# Patient Record
Sex: Male | Born: 1945 | Race: White | Hispanic: No | Marital: Single
Health system: Southern US, Community
[De-identification: ages and names within clinical notes are randomized; demographics above are authoritative.]

---

## 2019-10-20 ENCOUNTER — Emergency Department (HOSPITAL_COMMUNITY): Payer: No Typology Code available for payment source

## 2019-10-20 ENCOUNTER — Inpatient Hospital Stay (HOSPITAL_COMMUNITY): Payer: No Typology Code available for payment source

## 2019-10-20 ENCOUNTER — Inpatient Hospital Stay (HOSPITAL_COMMUNITY)
Admission: EM | Admit: 2019-10-20 | Discharge: 2019-11-09 | DRG: 246 | Disposition: E | Payer: No Typology Code available for payment source | Attending: Cardiovascular Disease | Admitting: Cardiovascular Disease

## 2019-10-20 ENCOUNTER — Encounter (HOSPITAL_COMMUNITY): Payer: Self-pay | Admitting: Cardiovascular Disease

## 2019-10-20 ENCOUNTER — Inpatient Hospital Stay (HOSPITAL_COMMUNITY): Admission: EM | Disposition: E | Payer: Self-pay | Source: Home / Self Care | Attending: Cardiovascular Disease

## 2019-10-20 DIAGNOSIS — J9601 Acute respiratory failure with hypoxia: Secondary | ICD-10-CM | POA: Diagnosis present

## 2019-10-20 DIAGNOSIS — I5021 Acute systolic (congestive) heart failure: Secondary | ICD-10-CM | POA: Diagnosis present

## 2019-10-20 DIAGNOSIS — Z978 Presence of other specified devices: Secondary | ICD-10-CM | POA: Diagnosis not present

## 2019-10-20 DIAGNOSIS — Z515 Encounter for palliative care: Secondary | ICD-10-CM | POA: Diagnosis not present

## 2019-10-20 DIAGNOSIS — I469 Cardiac arrest, cause unspecified: Secondary | ICD-10-CM | POA: Diagnosis not present

## 2019-10-20 DIAGNOSIS — R042 Hemoptysis: Secondary | ICD-10-CM | POA: Diagnosis not present

## 2019-10-20 DIAGNOSIS — I2102 ST elevation (STEMI) myocardial infarction involving left anterior descending coronary artery: Secondary | ICD-10-CM | POA: Diagnosis present

## 2019-10-20 DIAGNOSIS — G931 Anoxic brain damage, not elsewhere classified: Secondary | ICD-10-CM | POA: Diagnosis present

## 2019-10-20 DIAGNOSIS — I959 Hypotension, unspecified: Secondary | ICD-10-CM | POA: Diagnosis present

## 2019-10-20 DIAGNOSIS — J9602 Acute respiratory failure with hypercapnia: Secondary | ICD-10-CM | POA: Diagnosis present

## 2019-10-20 DIAGNOSIS — Z20822 Contact with and (suspected) exposure to covid-19: Secondary | ICD-10-CM | POA: Diagnosis present

## 2019-10-20 DIAGNOSIS — I2510 Atherosclerotic heart disease of native coronary artery without angina pectoris: Secondary | ICD-10-CM | POA: Diagnosis not present

## 2019-10-20 DIAGNOSIS — E872 Acidosis: Secondary | ICD-10-CM | POA: Diagnosis present

## 2019-10-20 DIAGNOSIS — H5702 Anisocoria: Secondary | ICD-10-CM | POA: Diagnosis present

## 2019-10-20 DIAGNOSIS — I2109 ST elevation (STEMI) myocardial infarction involving other coronary artery of anterior wall: Secondary | ICD-10-CM

## 2019-10-20 DIAGNOSIS — Z66 Do not resuscitate: Secondary | ICD-10-CM | POA: Diagnosis not present

## 2019-10-20 DIAGNOSIS — R34 Anuria and oliguria: Secondary | ICD-10-CM | POA: Diagnosis present

## 2019-10-20 DIAGNOSIS — G9341 Metabolic encephalopathy: Secondary | ICD-10-CM | POA: Diagnosis present

## 2019-10-20 DIAGNOSIS — I462 Cardiac arrest due to underlying cardiac condition: Secondary | ICD-10-CM | POA: Diagnosis present

## 2019-10-20 DIAGNOSIS — Z9289 Personal history of other medical treatment: Secondary | ICD-10-CM

## 2019-10-20 DIAGNOSIS — D72829 Elevated white blood cell count, unspecified: Secondary | ICD-10-CM | POA: Diagnosis present

## 2019-10-20 DIAGNOSIS — I472 Ventricular tachycardia: Secondary | ICD-10-CM | POA: Diagnosis present

## 2019-10-20 DIAGNOSIS — R570 Cardiogenic shock: Secondary | ICD-10-CM | POA: Diagnosis present

## 2019-10-20 DIAGNOSIS — L821 Other seborrheic keratosis: Secondary | ICD-10-CM | POA: Diagnosis present

## 2019-10-20 DIAGNOSIS — I255 Ischemic cardiomyopathy: Secondary | ICD-10-CM | POA: Diagnosis present

## 2019-10-20 DIAGNOSIS — Z1389 Encounter for screening for other disorder: Secondary | ICD-10-CM

## 2019-10-20 HISTORY — PX: LEFT HEART CATH AND CORONARY ANGIOGRAPHY: CATH118249

## 2019-10-20 HISTORY — PX: CORONARY STENT INTERVENTION: CATH118234

## 2019-10-20 HISTORY — PX: CENTRAL LINE INSERTION: CATH118232

## 2019-10-20 HISTORY — PX: CORONARY/GRAFT ACUTE MI REVASCULARIZATION: CATH118305

## 2019-10-20 LAB — CBG MONITORING, ED: Glucose-Capillary: 254 mg/dL — ABNORMAL HIGH (ref 70–99)

## 2019-10-20 LAB — POCT I-STAT, CHEM 8
BUN: 11 mg/dL (ref 8–23)
Calcium, Ion: 1.11 mmol/L — ABNORMAL LOW (ref 1.15–1.40)
Chloride: 107 mmol/L (ref 98–111)
Creatinine, Ser: 0.7 mg/dL (ref 0.61–1.24)
Glucose, Bld: 231 mg/dL — ABNORMAL HIGH (ref 70–99)
HCT: 50 % (ref 39.0–52.0)
Hemoglobin: 17 g/dL (ref 13.0–17.0)
Potassium: 3.7 mmol/L (ref 3.5–5.1)
Sodium: 139 mmol/L (ref 135–145)
TCO2: 18 mmol/L — ABNORMAL LOW (ref 22–32)

## 2019-10-20 LAB — BASIC METABOLIC PANEL
Anion gap: 13 (ref 5–15)
BUN: 11 mg/dL (ref 8–23)
CO2: 16 mmol/L — ABNORMAL LOW (ref 22–32)
Calcium: 8.1 mg/dL — ABNORMAL LOW (ref 8.9–10.3)
Chloride: 110 mmol/L (ref 98–111)
Creatinine, Ser: 1.11 mg/dL (ref 0.61–1.24)
GFR calc Af Amer: >60 mL/min (ref >60)
GFR calc non Af Amer: >60 mL/min (ref >60)
Glucose, Bld: 189 mg/dL — ABNORMAL HIGH (ref 70–99)
Potassium: 3.8 mmol/L (ref 3.5–5.1)
Sodium: 139 mmol/L (ref 135–145)

## 2019-10-20 LAB — POCT I-STAT 7, (LYTES, BLD GAS, ICA,H+H)
Acid-base deficit: 13 mmol/L — ABNORMAL HIGH (ref 0.0–2.0)
Acid-base deficit: 10 mmol/L — ABNORMAL HIGH (ref 0.0–2.0)
Acid-base deficit: 10 mmol/L — ABNORMAL HIGH (ref 0.0–2.0)
Bicarbonate: 15.8 mmol/L — ABNORMAL LOW (ref 20.0–28.0)
Bicarbonate: 17.7 mmol/L — ABNORMAL LOW (ref 20.0–28.0)
Bicarbonate: 14.9 mmol/L — ABNORMAL LOW (ref 20.0–28.0)
Calcium, Ion: 1.1 mmol/L — ABNORMAL LOW (ref 1.15–1.40)
Calcium, Ion: 1.17 mmol/L (ref 1.15–1.40)
Calcium, Ion: 1.11 mmol/L — ABNORMAL LOW (ref 1.15–1.40)
HCT: 50 % (ref 39.0–52.0)
HCT: 52 % (ref 39.0–52.0)
HCT: 52 % (ref 39.0–52.0)
Hemoglobin: 17 g/dL (ref 13.0–17.0)
Hemoglobin: 17.7 g/dL — ABNORMAL HIGH (ref 13.0–17.0)
Hemoglobin: 17.7 g/dL — ABNORMAL HIGH (ref 13.0–17.0)
O2 Saturation: 99 %
O2 Saturation: 100 %
O2 Saturation: 99 %
Patient temperature: 34.9
Patient temperature: 32.6
Potassium: 3.7 mmol/L (ref 3.5–5.1)
Potassium: 3.8 mmol/L (ref 3.5–5.1)
Potassium: 3.8 mmol/L (ref 3.5–5.1)
Sodium: 140 mmol/L (ref 135–145)
Sodium: 141 mmol/L (ref 135–145)
Sodium: 139 mmol/L (ref 135–145)
TCO2: 17 mmol/L — ABNORMAL LOW (ref 22–32)
TCO2: 19 mmol/L — ABNORMAL LOW (ref 22–32)
TCO2: 16 mmol/L — ABNORMAL LOW (ref 22–32)
pCO2 arterial: 47.4 mmHg (ref 32.0–48.0)
pCO2 arterial: 40.6 mmHg (ref 32.0–48.0)
pCO2 arterial: 24.5 mmHg — ABNORMAL LOW (ref 32.0–48.0)
pH, Arterial: 7.132 — CL (ref 7.350–7.450)
pH, Arterial: 7.236 — ABNORMAL LOW (ref 7.350–7.450)
pH, Arterial: 7.37 (ref 7.350–7.450)
pO2, Arterial: 214 mmHg — ABNORMAL HIGH (ref 83.0–108.0)
pO2, Arterial: 425 mmHg — ABNORMAL HIGH (ref 83.0–108.0)
pO2, Arterial: 126 mmHg — ABNORMAL HIGH (ref 83.0–108.0)

## 2019-10-20 LAB — CBC
HCT: 52 % (ref 39.0–52.0)
Hemoglobin: 17.9 g/dL — ABNORMAL HIGH (ref 13.0–17.0)
MCH: 30.4 pg (ref 26.0–34.0)
MCHC: 34.4 g/dL (ref 30.0–36.0)
MCV: 88.3 fL (ref 80.0–100.0)
Platelets: 291 10*3/uL (ref 150–400)
RBC: 5.89 MIL/uL — ABNORMAL HIGH (ref 4.22–5.81)
RDW: 13.2 % (ref 11.5–15.5)
WBC: 21.1 10*3/uL — ABNORMAL HIGH (ref 4.0–10.5)
nRBC: 0 % (ref 0.0–0.2)

## 2019-10-20 LAB — COMPREHENSIVE METABOLIC PANEL
ALT: 118 U/L — ABNORMAL HIGH (ref 0–44)
AST: 174 U/L — ABNORMAL HIGH (ref 15–41)
Albumin: 3.3 g/dL — ABNORMAL LOW (ref 3.5–5.0)
Alkaline Phosphatase: 60 U/L (ref 38–126)
Anion gap: 19 — ABNORMAL HIGH (ref 5–15)
BUN: 10 mg/dL (ref 8–23)
CO2: 14 mmol/L — ABNORMAL LOW (ref 22–32)
Calcium: 8.1 mg/dL — ABNORMAL LOW (ref 8.9–10.3)
Chloride: 107 mmol/L (ref 98–111)
Creatinine, Ser: 1.33 mg/dL — ABNORMAL HIGH (ref 0.61–1.24)
GFR calc Af Amer: >60 mL/min (ref >60)
GFR calc non Af Amer: 53 mL/min — ABNORMAL LOW (ref >60)
Glucose, Bld: 273 mg/dL — ABNORMAL HIGH (ref 70–99)
Potassium: 3.7 mmol/L (ref 3.5–5.1)
Sodium: 140 mmol/L (ref 135–145)
Total Bilirubin: 1.1 mg/dL (ref 0.3–1.2)
Total Protein: 6.2 g/dL — ABNORMAL LOW (ref 6.5–8.1)

## 2019-10-20 LAB — CBC WITH DIFFERENTIAL/PLATELET
Abs Immature Granulocytes: 0.35 10*3/uL — ABNORMAL HIGH (ref 0.00–0.07)
Basophils Absolute: 0.1 10*3/uL (ref 0.0–0.1)
Basophils Relative: 1 %
Eosinophils Absolute: 0.1 10*3/uL (ref 0.0–0.5)
Eosinophils Relative: 1 %
HCT: 50.7 % (ref 39.0–52.0)
Hemoglobin: 16.7 g/dL (ref 13.0–17.0)
Immature Granulocytes: 2 %
Lymphocytes Relative: 25 %
Lymphs Abs: 4.6 10*3/uL — ABNORMAL HIGH (ref 0.7–4.0)
MCH: 30.5 pg (ref 26.0–34.0)
MCHC: 32.9 g/dL (ref 30.0–36.0)
MCV: 92.5 fL (ref 80.0–100.0)
Monocytes Absolute: 1.1 10*3/uL — ABNORMAL HIGH (ref 0.1–1.0)
Monocytes Relative: 6 %
Neutro Abs: 11.9 10*3/uL — ABNORMAL HIGH (ref 1.7–7.7)
Neutrophils Relative %: 65 %
Platelets: 253 10*3/uL (ref 150–400)
RBC: 5.48 MIL/uL (ref 4.22–5.81)
RDW: 13.1 % (ref 11.5–15.5)
WBC: 18.2 10*3/uL — ABNORMAL HIGH (ref 4.0–10.5)
nRBC: 0 % (ref 0.0–0.2)

## 2019-10-20 LAB — GLUCOSE, CAPILLARY
Glucose-Capillary: 170 mg/dL — ABNORMAL HIGH (ref 70–99)
Glucose-Capillary: 166 mg/dL — ABNORMAL HIGH (ref 70–99)
Glucose-Capillary: 166 mg/dL — ABNORMAL HIGH (ref 70–99)
Glucose-Capillary: 176 mg/dL — ABNORMAL HIGH (ref 70–99)

## 2019-10-20 LAB — LIPID PANEL
Cholesterol: 170 mg/dL (ref 0–200)
HDL: 33 mg/dL — ABNORMAL LOW (ref >40)
LDL Cholesterol: 118 mg/dL — ABNORMAL HIGH (ref 0–99)
Total CHOL/HDL Ratio: 5.2 RATIO
Triglycerides: 93 mg/dL (ref <150)
VLDL: 19 mg/dL (ref 0–40)

## 2019-10-20 LAB — HEMOGLOBIN A1C
Hgb A1c MFr Bld: 5.2 % (ref 4.8–5.6)
Mean Plasma Glucose: 102.54 mg/dL

## 2019-10-20 LAB — PROTIME-INR
INR: 1.3 — ABNORMAL HIGH (ref 0.8–1.2)
INR: 1.2 (ref 0.8–1.2)
Prothrombin Time: 15.2 seconds (ref 11.4–15.2)
Prothrombin Time: 14.9 seconds (ref 11.4–15.2)

## 2019-10-20 LAB — TROPONIN I (HIGH SENSITIVITY)
Troponin I (High Sensitivity): 9239 ng/L (ref <18)
Troponin I (High Sensitivity): >27000 ng/L (ref <18)

## 2019-10-20 LAB — APTT
aPTT: 33 seconds (ref 24–36)
aPTT: 142 seconds — ABNORMAL HIGH (ref 24–36)

## 2019-10-20 LAB — SARS CORONAVIRUS 2 BY RT PCR (HOSPITAL ORDER, PERFORMED IN ~~LOC~~ HOSPITAL LAB): SARS Coronavirus 2: NEGATIVE

## 2019-10-20 SURGERY — LEFT HEART CATH AND CORONARY ANGIOGRAPHY
Anesthesia: LOCAL

## 2019-10-20 MED ORDER — HYDRALAZINE HCL 20 MG/ML IJ SOLN: 10.0000 mg | INTRAMUSCULAR | Status: DC | PRN

## 2019-10-20 MED ORDER — FENTANYL 2500MCG IN NS 250ML (10MCG/ML) PREMIX INFUSION
INTRAVENOUS | Status: AC
  Filled 2019-10-20: qty 250

## 2019-10-20 MED ORDER — TIROFIBAN HCL IN NACL 5-0.9 MG/100ML-% IV SOLN: 0.1500 ug/kg/min | INTRAVENOUS | Status: DC

## 2019-10-20 MED ORDER — FENTANYL BOLUS VIA INFUSION
25.0000 ug | INTRAVENOUS | Status: DC | PRN
  Filled 2019-10-20: qty 25

## 2019-10-20 MED ORDER — INSULIN ASPART 100 UNIT/ML ~~LOC~~ SOLN
0.0000 [IU] | SUBCUTANEOUS | Status: DC
  Administered 2019-10-20 – 2019-10-21 (×3): 2 [IU] via SUBCUTANEOUS
  Administered 2019-10-21 (×2): 1 [IU] via SUBCUTANEOUS
  Administered 2019-10-21: 2 [IU] via SUBCUTANEOUS
  Administered 2019-10-22 – 2019-10-23 (×5): 1 [IU] via SUBCUTANEOUS

## 2019-10-20 MED ORDER — NOREPINEPHRINE 4 MG/250ML-% IV SOLN
0.0000 ug/min | INTRAVENOUS | Status: DC
  Administered 2019-10-20: 5 ug/min via INTRAVENOUS

## 2019-10-20 MED ORDER — SODIUM CHLORIDE 0.9% FLUSH
3.0000 mL | Freq: Two times a day (BID) | INTRAVENOUS | Status: DC
  Administered 2019-10-21 – 2019-10-24 (×7): 3 mL via INTRAVENOUS

## 2019-10-20 MED ORDER — ACETAMINOPHEN 325 MG PO TABS
650.0000 mg | ORAL_TABLET | ORAL | Status: DC | PRN
  Administered 2019-10-21 – 2019-10-23 (×3): 650 mg via ORAL
  Filled 2019-10-20 (×3): qty 2

## 2019-10-20 MED ORDER — ORAL CARE MOUTH RINSE
15.0000 mL | OROMUCOSAL | Status: DC
  Administered 2019-10-20 – 2019-10-24 (×41): 15 mL via OROMUCOSAL

## 2019-10-20 MED ORDER — SODIUM CHLORIDE 0.9 % IV SOLN
1.0000 ug/kg/min | INTRAVENOUS | Status: DC
  Administered 2019-10-20: 1 ug/kg/min via INTRAVENOUS
  Filled 2019-10-20: qty 20

## 2019-10-20 MED ORDER — HEPARIN (PORCINE) IN NACL 1000-0.9 UT/500ML-% IV SOLN
INTRAVENOUS | Status: AC
  Filled 2019-10-20: qty 1000

## 2019-10-20 MED ORDER — HEPARIN SODIUM (PORCINE) 1000 UNIT/ML IJ SOLN
INTRAMUSCULAR | Status: DC | PRN
  Administered 2019-10-20: 8000 [IU] via INTRAVENOUS

## 2019-10-20 MED ORDER — SODIUM CHLORIDE 0.9 % IV SOLN: INTRAVENOUS | Status: DC

## 2019-10-20 MED ORDER — NITROGLYCERIN 1 MG/10 ML FOR IR/CATH LAB
INTRA_ARTERIAL | Status: AC
  Filled 2019-10-20: qty 10

## 2019-10-20 MED ORDER — FENTANYL 2500MCG IN NS 250ML (10MCG/ML) PREMIX INFUSION
100.0000 ug/h | INTRAVENOUS | Status: DC
  Administered 2019-10-20: 100 ug/h via INTRAVENOUS
  Filled 2019-10-20 (×2): qty 250

## 2019-10-20 MED ORDER — SODIUM BICARBONATE 8.4 % IV SOLN
INTRAVENOUS | Status: DC | PRN
  Administered 2019-10-20: 50 meq via INTRAVENOUS

## 2019-10-20 MED ORDER — ATORVASTATIN CALCIUM 80 MG PO TABS
80.0000 mg | ORAL_TABLET | Freq: Every day | ORAL | Status: DC
  Administered 2019-10-21: 80 mg via ORAL
  Filled 2019-10-20: qty 1

## 2019-10-20 MED ORDER — MIDAZOLAM HCL 2 MG/2ML IJ SOLN
INTRAMUSCULAR | Status: AC
  Filled 2019-10-20: qty 2

## 2019-10-20 MED ORDER — MIDAZOLAM BOLUS VIA INFUSION
1.0000 mg | INTRAVENOUS | Status: DC | PRN
  Administered 2019-10-21: 1 mg via INTRAVENOUS
  Filled 2019-10-20: qty 1

## 2019-10-20 MED ORDER — SODIUM CHLORIDE 0.9 % IV SOLN: 250.0000 mL | INTRAVENOUS | Status: DC | PRN

## 2019-10-20 MED ORDER — CARVEDILOL 3.125 MG PO TABS: 3.1250 mg | ORAL_TABLET | Freq: Two times a day (BID) | ORAL | Status: DC

## 2019-10-20 MED ORDER — SODIUM CHLORIDE 0.9 % IV SOLN
INTRAVENOUS | Status: AC | PRN
  Administered 2019-10-20: 10 mL/h via INTRAVENOUS

## 2019-10-20 MED ORDER — SODIUM CHLORIDE 0.9% FLUSH: 3.0000 mL | INTRAVENOUS | Status: DC | PRN

## 2019-10-20 MED ORDER — TICAGRELOR 90 MG PO TABS
ORAL_TABLET | ORAL | Status: DC | PRN
  Administered 2019-10-20: 180 mg via NASOGASTRIC

## 2019-10-20 MED ORDER — ASPIRIN 81 MG PO CHEW
81.0000 mg | CHEWABLE_TABLET | Freq: Every day | ORAL | Status: DC
  Administered 2019-10-21: 81 mg via ORAL
  Filled 2019-10-20: qty 1

## 2019-10-20 MED ORDER — ONDANSETRON HCL 4 MG/2ML IJ SOLN: 4.0000 mg | Freq: Four times a day (QID) | INTRAMUSCULAR | Status: DC | PRN

## 2019-10-20 MED ORDER — VERAPAMIL HCL 2.5 MG/ML IV SOLN
INTRAVENOUS | Status: DC | PRN
  Administered 2019-10-20: 17:00:00 10 mL via INTRA_ARTERIAL

## 2019-10-20 MED ORDER — TIROFIBAN HCL IN NACL 5-0.9 MG/100ML-% IV SOLN
INTRAVENOUS | Status: AC
  Filled 2019-10-20: qty 100

## 2019-10-20 MED ORDER — ROCURONIUM BROMIDE 50 MG/5ML IV SOLN
INTRAVENOUS | Status: DC | PRN
  Administered 2019-10-20: 100 mg via INTRAVENOUS

## 2019-10-20 MED ORDER — IOHEXOL 350 MG/ML SOLN
INTRAVENOUS | Status: DC | PRN
  Administered 2019-10-20: 18:00:00 110 mL

## 2019-10-20 MED ORDER — ASPIRIN 81 MG PO CHEW
CHEWABLE_TABLET | ORAL | Status: DC | PRN
  Administered 2019-10-20: 324 mg via NASOGASTRIC

## 2019-10-20 MED ORDER — FENTANYL CITRATE (PF) 100 MCG/2ML IJ SOLN
50.0000 ug | Freq: Once | INTRAMUSCULAR | Status: DC
  Filled 2019-10-20: qty 2

## 2019-10-20 MED ORDER — TICAGRELOR 90 MG PO TABS
ORAL_TABLET | ORAL | Status: AC
  Filled 2019-10-20: qty 2

## 2019-10-20 MED ORDER — LIDOCAINE-EPINEPHRINE 1 %-1:100000 IJ SOLN
INTRAMUSCULAR | Status: AC
  Filled 2019-10-20: qty 1

## 2019-10-20 MED ORDER — LIDOCAINE HCL (PF) 1 % IJ SOLN
INTRAMUSCULAR | Status: AC
  Filled 2019-10-20: qty 30

## 2019-10-20 MED ORDER — SODIUM BICARBONATE 8.4 % IV SOLN
INTRAVENOUS | Status: AC
  Filled 2019-10-20: qty 50

## 2019-10-20 MED ORDER — CHLORHEXIDINE GLUCONATE 0.12% ORAL RINSE (MEDLINE KIT)
15.0000 mL | Freq: Two times a day (BID) | OROMUCOSAL | Status: DC
  Administered 2019-10-20 – 2019-10-24 (×9): 15 mL via OROMUCOSAL

## 2019-10-20 MED ORDER — FENTANYL 2500MCG IN NS 250ML (10MCG/ML) PREMIX INFUSION
0.0000 ug/h | INTRAVENOUS | Status: DC
  Administered 2019-10-20: 50 ug/h via INTRAVENOUS

## 2019-10-20 MED ORDER — TIROFIBAN (AGGRASTAT) BOLUS VIA INFUSION
INTRAVENOUS | Status: DC | PRN
  Administered 2019-10-20: 2267.5 ug via INTRAVENOUS

## 2019-10-20 MED ORDER — TICAGRELOR 90 MG PO TABS
90.0000 mg | ORAL_TABLET | Freq: Two times a day (BID) | ORAL | Status: DC
  Administered 2019-10-21: 90 mg via ORAL
  Filled 2019-10-20: qty 1

## 2019-10-20 MED ORDER — MIDAZOLAM HCL 2 MG/2ML IJ SOLN: 1.0000 mg | Freq: Once | INTRAMUSCULAR | Status: DC

## 2019-10-20 MED ORDER — SODIUM CHLORIDE 0.9 % IV BOLUS
500.0000 mL | Freq: Once | INTRAVENOUS | Status: AC
  Administered 2019-10-20: 500 mL via INTRAVENOUS

## 2019-10-20 MED ORDER — MIDAZOLAM 50MG/50ML (1MG/ML) PREMIX INFUSION
INTRAVENOUS | Status: AC
  Filled 2019-10-20: qty 50

## 2019-10-20 MED ORDER — HEPARIN (PORCINE) IN NACL 1000-0.9 UT/500ML-% IV SOLN
INTRAVENOUS | Status: DC | PRN
  Administered 2019-10-20 (×2): 500 mL

## 2019-10-20 MED ORDER — LIDOCAINE-EPINEPHRINE 1 %-1:100000 IJ SOLN
INTRAMUSCULAR | Status: DC | PRN
  Administered 2019-10-20: 2 mL

## 2019-10-20 MED ORDER — CISATRACURIUM BOLUS VIA INFUSION
0.1000 mg/kg | Freq: Once | INTRAVENOUS | Status: AC
  Administered 2019-10-20: 9.1 mg via INTRAVENOUS
  Filled 2019-10-20: qty 10

## 2019-10-20 MED ORDER — ETOMIDATE 2 MG/ML IV SOLN
INTRAVENOUS | Status: DC | PRN
  Administered 2019-10-20: 20 mg via INTRAVENOUS

## 2019-10-20 MED ORDER — TIROFIBAN HCL IN NACL 5-0.9 MG/100ML-% IV SOLN
INTRAVENOUS | Status: AC | PRN
  Administered 2019-10-20: 0.15 ug/kg/min via INTRAVENOUS

## 2019-10-20 MED ORDER — ASPIRIN 81 MG PO CHEW
CHEWABLE_TABLET | ORAL | Status: AC
  Filled 2019-10-20: qty 4

## 2019-10-20 MED ORDER — MIDAZOLAM 50MG/50ML (1MG/ML) PREMIX INFUSION
2.0000 mg/h | INTRAVENOUS | Status: DC
  Administered 2019-10-20 – 2019-10-21 (×2): 2 mg/h via INTRAVENOUS
  Filled 2019-10-20 (×2): qty 50

## 2019-10-20 MED ORDER — ARTIFICIAL TEARS OPHTHALMIC OINT
1.0000 "application " | TOPICAL_OINTMENT | Freq: Three times a day (TID) | OPHTHALMIC | Status: DC
  Administered 2019-10-20 – 2019-10-24 (×10): 1 via OPHTHALMIC
  Filled 2019-10-20 (×3): qty 3.5

## 2019-10-20 MED ORDER — MIDAZOLAM HCL 2 MG/2ML IJ SOLN
INTRAMUSCULAR | Status: DC | PRN
  Administered 2019-10-20: 2 mg via INTRAVENOUS

## 2019-10-20 MED ORDER — NITROGLYCERIN 1 MG/10 ML FOR IR/CATH LAB
INTRA_ARTERIAL | Status: DC | PRN
  Administered 2019-10-20: 150 ug via INTRACORONARY

## 2019-10-20 MED ORDER — IOHEXOL 350 MG/ML SOLN
INTRAVENOUS | Status: AC
  Filled 2019-10-20: qty 1

## 2019-10-20 MED ORDER — MIDAZOLAM 50MG/50ML (1MG/ML) PREMIX INFUSION
0.5000 mg/h | INTRAVENOUS | Status: DC
  Administered 2019-10-20: 1 mg/h via INTRAVENOUS

## 2019-10-20 MED ORDER — MAGNESIUM SULFATE 2 GM/50ML IV SOLN
2.0000 g | Freq: Once | INTRAVENOUS | Status: AC
  Administered 2019-10-20: 2 g via INTRAVENOUS
  Filled 2019-10-20: qty 50

## 2019-10-20 MED ORDER — VERAPAMIL HCL 2.5 MG/ML IV SOLN
INTRAVENOUS | Status: AC
  Filled 2019-10-20: qty 2

## 2019-10-20 MED ORDER — PANTOPRAZOLE SODIUM 40 MG IV SOLR
40.0000 mg | Freq: Every day | INTRAVENOUS | Status: DC
  Administered 2019-10-21: 40 mg via INTRAVENOUS
  Filled 2019-10-20: qty 40

## 2019-10-20 MED ORDER — CISATRACURIUM BOLUS VIA INFUSION
0.0500 mg/kg | INTRAVENOUS | Status: DC | PRN
  Filled 2019-10-20: qty 5

## 2019-10-20 MED ORDER — NITROGLYCERIN 0.4 MG SL SUBL: 0.4000 mg | SUBLINGUAL_TABLET | SUBLINGUAL | Status: DC | PRN

## 2019-10-20 MED ORDER — HEPARIN SODIUM (PORCINE) 5000 UNIT/ML IJ SOLN
5000.0000 [IU] | Freq: Three times a day (TID) | INTRAMUSCULAR | Status: DC
  Administered 2019-10-21 – 2019-10-22 (×6): 5000 [IU] via SUBCUTANEOUS
  Filled 2019-10-20 (×6): qty 1

## 2019-10-20 MED ORDER — LABETALOL HCL 5 MG/ML IV SOLN: 10.0000 mg | INTRAVENOUS | Status: DC | PRN

## 2019-10-20 SURGICAL SUPPLY — 24 items
BALLN SAPPHIRE 2.5X12 (BALLOONS) ×3
BALLN SAPPHIRE ~~LOC~~ 3.25X15 (BALLOONS) ×3 IMPLANT
BALLOON SAPPHIRE 2.5X12 (BALLOONS) ×2 IMPLANT
CATH 5FR JL3.5 JR4 ANG PIG MP (CATHETERS) ×3 IMPLANT
CATH VISTA GUIDE 6FR XBLAD3.5 (CATHETERS) ×3 IMPLANT
DEVICE RAD COMP TR BAND LRG (VASCULAR PRODUCTS) ×3 IMPLANT
GLIDESHEATH SLEND SS 6F .021 (SHEATH) ×3 IMPLANT
GUIDEWIRE ANGLED .035X150CM (WIRE) ×3 IMPLANT
GUIDEWIRE INQWIRE 1.5J.035X260 (WIRE) ×2 IMPLANT
HOVERMATT SINGLE USE (MISCELLANEOUS) ×3 IMPLANT
INQWIRE 1.5J .035X260CM (WIRE) ×3
KIT CV MULTILUMEN 7FR 20 (SET/KITS/TRAYS/PACK) ×3
KIT CV MULTILUMEN 7FR 20 SUB (SET/KITS/TRAYS/PACK) ×2 IMPLANT
KIT ENCORE 26 ADVANTAGE (KITS) ×3 IMPLANT
KIT HEART LEFT (KITS) ×3 IMPLANT
KIT MICROPUNCTURE NIT STIFF (SHEATH) ×3 IMPLANT
PACK CARDIAC CATHETERIZATION (CUSTOM PROCEDURE TRAY) ×3 IMPLANT
SHEATH PROBE COVER 6X72 (BAG) ×3 IMPLANT
STENT RESOLUTE ONYX 3.0X22 (Permanent Stent) ×3 IMPLANT
TRANSDUCER W/STOPCOCK (MISCELLANEOUS) ×3 IMPLANT
TUBING CIL FLEX 10 FLL-RA (TUBING) ×3 IMPLANT
WIRE COUGAR XT STRL 190CM (WIRE) ×3 IMPLANT
WIRE HI TORQ VERSACORE-J 145CM (WIRE) ×3 IMPLANT
WIRE HI TORQ WHISPER MS 190CM (WIRE) ×3 IMPLANT

## 2019-10-20 NOTE — ED Triage Notes (Signed)
Per [facility] pt was dropping a friend off then collapsed. Friend initiated CPR at 3:30. 3 epi given, shocked at 120J another round of CPR and then ROSC. IO to left humerus. Bagged on arrival. Unequal pupils.

## 2019-10-20 NOTE — Progress Notes (Signed)
vLTM started . Using same leads for routine and ltm.  Neurology notified.  Event button tested.  [facility] to monitor

## 2019-10-20 NOTE — Progress Notes (Signed)
eLink Physician-Brief Progress Note Patient Name: Bradley Rodgers DOB: 1946-04-15 MRN: [id number]   Date of Service  11-06-2019  HPI/Events of Note  Multiple issues: 1. Oliguria - CVP = 10 and 2. ABG on 60%/PRVC 24/TV 450/P 5 = 7.37/24.5/126.   eICU Interventions  Will order: 1. Bolus with 0.9 NaCl 500 mL IV over 30 minutes now.  2. Continue present ventilator management.       Intervention Category Major Interventions: Respiratory failure - evaluation and management Intermediate Interventions: Oliguria - evaluation and management  Bradley Rodgers [name] Nov 06, 2019, 11:20 PM

## 2019-10-20 NOTE — H&P (Addendum)
Cardiology Admission History and Physical:   Patient ID: Bradley Rodgers MRN: [id number]; DOB: 30-Jun-1945   Admission date: 11/02/2019  Primary Care Provider: No primary care provider on file. Primary Cardiologist: No primary care provider on file.  Primary Electrophysiologist:  None   Chief Complaint:  Cardiac Arrest  Patient Profile:   Bradley Rodgers is a 74 y.o. male with unknown past medical history, presenting with out of hospital cardiac arrest  History of Present Illness:   Bradley Rodgers was dropping off a friend today, when he suddenly collapsed and was unconscious.  Bystander CPR was done and EMS was immediately called.  On EMS arrival, the patient was given 3 mg of epinephrine and he received 1 defibrillation, restoring sinus rhythm after a period of PEA.  He is evaluated in the emergency department where he is currently undergoing intubation.  His hemodynamics are currently stable.  His EKG demonstrates anterolateral STEMI and he will be brought emergently to the cardiac catheterization lab.  There is no contact information for any family members.  His procedure will be done emergently.  There is currently no past medical history available.   History reviewed. No pertinent past medical history.  History reviewed. No pertinent surgical history.   Medications Prior to Admission: Prior to Admission medications   Not on File     Allergies:   Not on File  Social History:   Social History   Socioeconomic History  . Marital status: Not on file    Spouse name: Not on file  . Number of children: Not on file  . Years of education: Not on file  . Highest education level: Not on file  Occupational History  . Not on file  Tobacco Use  . Smoking status: Not on file  Substance and Sexual Activity  . Alcohol use: Not on file  . Drug use: Not on file  . Sexual activity: Not on file  Other Topics Concern  . Not on file  Social History Narrative  . Not on file    Social Determinants of Health   Financial Resource Strain:   . Difficulty of Paying Living Expenses:   Food Insecurity:   . Worried About Charity fundraiser in the Last Year:   . Arboriculturist in the Last Year:   Transportation Needs:   . Film/video editor (Medical):   Marland Kitchen Lack of Transportation (Non-Medical):   Physical Activity:   . Days of Exercise per Week:   . Minutes of Exercise per Session:   Stress:   . Feeling of Stress :   Social Connections:   . Frequency of Communication with Friends and Family:   . Frequency of Social Gatherings with Friends and Family:   . Attends Religious Services:   . Active Member of Clubs or Organizations:   . Attends Archivist Meetings:   Marland Kitchen Marital Status:   Intimate Partner Violence:   . Fear of Current or Ex-Partner:   . Emotionally Abused:   Marland Kitchen Physically Abused:   . Sexually Abused:     Family History: Unable to obtain family history as patient is unconscious and no family members contact information on file The patient's family history is not on file.    ROS:  Please see the history of present illness.  All other ROS reviewed and negative.     Physical Exam/Data:   Vitals:   10/26/2019 1636 10/21/2019 1646 10/09/2019 1648  BP: 117/79 (!) 114/101   Pulse: 87 (!)  116   Resp: 13 18   Temp:  (!) 97 F (36.1 C)   TempSrc:  Temporal   SpO2: 98% 99%   Weight:   89.8 kg   No intake or output data in the 24 hours ending 2019-11-04 1704 Last 3 Weights Nov 04, 2019  Weight (lbs) 198 lb  Weight (kg) 89.812 kg     There is no height or weight on file to calculate BMI.  General: Intubated, unresponsive HEENT: Left pupil dilated, asymmetric with right Lymph: no adenopathy Neck: no JVD Endocrine:  No thryomegaly Vascular: No carotid bruits; FA pulses 2+ bilaterally Cardiac:  normal S1, S2; RRR; no murmur  Lungs:  clear to auscultation bilaterally, no wheezing, rhonchi or rales  Abd: soft, nontender, no hepatomegaly  Ext:  no edema Musculoskeletal:  No deformities, unable to further assess Skin: warm and dry  Neuro: Unconscious, unresponsive Psych: Unable to assess   EKG:  The ECG that was done today was personally reviewed and demonstrates normal sinus rhythm with acute anterolateral STEMI  Relevant CV Studies: Pending  Laboratory Data:  High Sensitivity Troponin:  No results for input(s): TROPONINIHS in the last 720 hours.    ChemistryNo results for input(s): NA, K, CL, CO2, GLUCOSE, BUN, CREATININE, CALCIUM, GFRNONAA, GFRAA, ANIONGAP in the last 168 hours.  No results for input(s): PROT, ALBUMIN, AST, ALT, ALKPHOS, BILITOT in the last 168 hours. HematologyNo results for input(s): WBC, RBC, HGB, HCT, MCV, MCH, MCHC, RDW, PLT in the last 168 hours. BNPNo results for input(s): BNP, PROBNP in the last 168 hours.  DDimer No results for input(s): DDIMER in the last 168 hours.   Radiology/Studies:  No results found.     TIMI Risk Score for ST  Elevation MI:   The patient's TIMI risk score is 10, which indicates a 35.9% risk of all cause mortality at 30 days.    Assessment and Plan:   1. Out of hospital cardiac arrest 2. Anterior STEMI 3. Acute ventilator dependent respiratory failure secondary to #1 4. Anoxic encephalopathy  The patient is critically ill after suffering an out of hospital cardiac arrest.  He remains unconscious.  After discussion with Dr. Jeraldine Loots in the emergency department, we agreed the patient should be taken emergently for a noncontrasted head CT to rule out intracranial hemorrhage in light of unequal pupils.  As long as his CT is negative, he will be brought emergently for cardiac catheterization and primary PCI.  The patient is at extremely high risk of in-hospital morbidity and mortality with out of hospital arrest, guarded neurologic prognosis, and EKG evidence of large anterior STEMI.  Severity of Illness: The appropriate patient status for this patient is INPATIENT.  Inpatient status is judged to be reasonable and necessary in order to provide the required intensity of service to ensure the patient's safety. The patient's presenting symptoms, physical exam findings, and initial radiographic and laboratory data in the context of their chronic comorbidities is felt to place them at high risk for further clinical deterioration. Furthermore, it is not anticipated that the patient will be medically stable for discharge from the hospital within 2 midnights of admission.  * I certify that at the point of admission it is my clinical judgment that the patient will require inpatient hospital care spanning beyond 2 midnights from the point of admission due to high intensity of service, high risk for further deterioration and high frequency of surveillance required.*   The patient is critically ill with multiple organ systems failure and  requires high complexity decision making for assessment and support, frequent evaluation and titration of therapies, application of advanced monitoring technologies and extensive interpretation of multiple databases.   Critical Care Time devoted to patient care services described in this note is 40 minutes.  For questions or updates, please contact [facility] Please consult [email or web address] for contact info under   Signed, Tonny Bollman, MD  2019/11/08 5:04 PM

## 2019-10-20 NOTE — H&P (Signed)
NAME:  Bradley Rodgers, MRN:  [id number], DOB:  1945-07-26, LOS: 0 ADMISSION DATE:  14-Nov-2019, CONSULTATION DATE:  2019/11/14 REFERRING MD:  Dr. Excell Seltzer, CHIEF COMPLAINT:  Cardiac Arrest  Brief History   74 year old male with unknown history presenting with out of hospital cardiac arrest found to have anterolateral STEMI s/p PCI and DES to LAD.  History of present illness    HPI obtained from medical chart review as patient is intubated on mechanical ventilation.   74 year old male with unknown past medical history presenting with witnessed out of hospital cardiac arrest.  Bystander CPR performed and on EMS arrival, found to be in PEA.  ROSC obtained after 3 rounds of CPR and required 1 defibrillation.  He required intubation in the ER.  Found on EKG to have acute anterolateral STEMI.  Labs this far noted for WBC 18.2, INR 1.3, pending CMET.  Remained hemodynamically stable in ER but remained unresponsive. [place]len for CT head which did not show any acute abnormalities.  Patient taken emergently to cath lab where a DES was placed to his LAD.  PCCM consulted for further medical and ventilator management.   Past Medical History  unknown  Significant Hospital Events   5/12 Admit  Consults:  PCCM  Procedures:  5/12 ETT >> 5/12 LHC  5/12 L IJ CVL >>  Significant Diagnostic Tests:  5/12 LHC >>   LV end diastolic pressure is moderately elevated.  The left ventricular ejection fraction is 35-45% by visual estimate.  Prox RCA lesion is 40% stenosed.  RPAV lesion is 50% stenosed.  RPDA lesion is 75% stenosed.  Mid LM to Dist LM lesion is 40% stenosed.  Ramus lesion is 80% stenosed.  Prox LAD to Mid LAD lesion is 100% stenosed.  A drug-eluting stent was successfully placed using a STENT RESOLUTE ONYX 3.0X22.  Post intervention, there is a 0% residual stenosis.   1.  Acute total occlusion of the proximal LAD, treated successfully with a 3.0 x 22 mm resolute Onyx DES 2.  Severe  stenosis of the ramus intermedius branch extending back to the left main 3.  Patent, small left circumflex 4.  Patent RCA with moderately severe bifurcation disease involving the posterior AV segment and PDA branches 5.  Moderate segmental LV systolic dysfunction with periapical akinesis and LVEF estimated at 40 to 45%, moderately elevated LVEDP consistent with acute systolic heart failure 6.  Successful placement of left internal jugular triple-lumen catheter under fluoroscopic guidance  5/12 CTH >> No acute intracranial abnormality noted.  Micro Data:  5/12 SARS 2 >>  Antimicrobials:    Interim history/subjective:    Objective   Blood pressure (!) 114/101, pulse (!) 116, temperature (!) 97 F (36.1 C), temperature source Temporal, resp. rate 18, height 6\' 1"  (1.854 m), weight 90.7 kg, SpO2 97 %.       No intake or output data in the 24 hours ending Nov 14, 2019 1721 Filed Weights   11-14-19 1648 Nov 14, 2019 1714  Weight: 89.8 kg 90.7 kg    Examination: General:  Critically ill older male unresponsive on MV HEENT: MM pink/dry, pale, left pupil 6/nonreactive, right pupil 3, sluggish, ETT/ OGT Neuro: unresponsive, slight rhythmic movement of tongue/face CV: rr, no murmur, TR band to right wrist PULM:  MV supported breaths, CTA GI: soft, bs hypo Extremities: cool/dry, no LE edema  Skin: no rashes, multiple skin plaques  CXR reviewed: ETT 1.3 above carina, OGT in satisfactory position, mild right upper lobe atelectasis  Resolved Hospital Problem list    Assessment & Plan:  Cardiac arrest Acute Anterolateral STEMI s/p PCI with DES to LAD -It was a witnessed arrest and he received bystander CPR followed by 3mg  of epi and one defibrillation in the ED before ROSC was achieved  P: Start TTM, goal 33 degrees. Will need arterial line. Pressor support as needed for MAP goal 65 Assess CVP's, echo, UDS Trend troponin, lactate. Cardiology following, appreciate assistance  Continue  ASA  Continue aggrastat per Cards recommendations  Respiratory insufficiency / Acute hypoxic respiratory failure  -In the setting of an acute STEMI as above P: Continue ventilator support with lung protective strategies  CXR now, to verify ETT placement and CVL placement Wean PEEP and FiO2 for sats greater than 90%. Head of bed elevated 30 degrees. Plateau pressures less than 30 cm H20.  Follow intermittent chest x-ray and ABG.   Hold SBT until off NMB. Ensure adequate pulmonary hygiene  Follow cultures  VAP bundle in place  PAD protocol  At risk for anoxic encephalopathy. - initial CTH negative   P: Minimize sedation RASS goal: -5 until off NMB Hold daily WUA until off NMB Assess EEG now, ? Facial myoclonus Neuro consult once rewarmed  Neuro prognostication is guarded  At risk for multiple metabolic derangements during cooling. P: NS @ 75 Correct electrolytes as indicated. BMP q2hrs x 4 then q4hrs.    At risk for hyperglycemia during cooling. P: ICU hyperglycemia protocol.  Leukocytosis -WBC 18.2 on admission, likely reactive, no acute signs of infection currently  P: Trend CBC and fever curve  Follow cultures  Check UA  Best practice:  Diet: NPO Pain/Anxiety/Delirium protocol (if indicated): fentanyl/ Versed  VAP protocol (if indicated): yes DVT prophylaxis: heparin/ SCD GI prophylaxis: PPI Glucose control: CBG q 4 Mobility: BR Code Status: Full  Family Communication: pending Disposition: ICU   Labs   CBC: Recent Labs  Lab 11/05/2019 1645  WBC 18.2*  NEUTROABS 11.9*  HGB 16.7  HCT 50.7  MCV 92.5  PLT 347    Basic Metabolic Panel: No results for input(s): NA, K, CL, CO2, GLUCOSE, BUN, CREATININE, CALCIUM, MG, PHOS in the last 168 hours. GFR: CrCl cannot be calculated (No successful lab value found.). Recent Labs  Lab 10/23/2019 1645  WBC 18.2*    Liver Function Tests: No results for input(s): AST, ALT, ALKPHOS, BILITOT, PROT, ALBUMIN  in the last 168 hours. No results for input(s): LIPASE, AMYLASE in the last 168 hours. No results for input(s): AMMONIA in the last 168 hours.  ABG No results found for: PHART, PCO2ART, PO2ART, HCO3, TCO2, ACIDBASEDEF, O2SAT   Coagulation Profile: Recent Labs  Lab 11/03/2019 1645  INR 1.3*    Cardiac Enzymes: No results for input(s): CKTOTAL, CKMB, CKMBINDEX, TROPONINI in the last 168 hours.  HbA1C: No results found for: HGBA1C  CBG: Recent Labs  Lab 10/09/2019 1640  GLUCAP 254*    Review of Systems:    unable as patient is encephalopathic and intubated on mechanical ventilation   Past Medical History  He,  has no past medical history on file.   Surgical History   History reviewed. No pertinent surgical history.   Social History    unable  Family History   His family history is not on file.   Allergies Not on File   Home Medications  Prior to Admission medications   Not on File      CCT: 45 mins  Kennieth Rad, MSN, AGACNP-BC [facility] 10/19/2019, 7:03 PM  See Amion for personal pager PCCM on call pager [phone]

## 2019-10-20 NOTE — Progress Notes (Signed)
EEG complete - results pending 

## 2019-10-20 NOTE — Progress Notes (Signed)
RT NOTES: Transported patient from cath lab to 2H24 on vent without complications.

## 2019-10-20 NOTE — Progress Notes (Addendum)
Pt transported from ED to Cath lab without incident.

## 2019-10-20 NOTE — ED Provider Notes (Signed)
[facility] EMERGENCY DEPARTMENT Provider Note   CSN: [id number] Arrival date & time: 10/12/2019  1635     History Chief Complaint  Patient presents with  . STEMI/Post CPR    Bradley Rodgers is a 74 y.o. male.  HPI Patient presents via EMS in extremis, having had a witnessed arrest.  Level 5 Kabbe secondary to acuity of condition. History provided by EMS providers. They note that the patient was reportedly in his usual state of health, dropping a friend, when that her friend witnessed the patient losing consciousness. Reportedly she began CPR soon thereafter, and EMS notes that on their arrival the patient was agonal.  He received CPR, epinephrine, and at least one set of electrical shock for cardioversion.       On arrival the patient is unresponsive, receiving supplemental oxygen via [facility] airway.    No past medical history on file. History not obtainable, patient is new to our system, has no medical record, presents in extremis.   No family history on file.  Social History   Tobacco Use  . Smoking status: Not on file  Substance Use Topics  . Alcohol use: Not on file  . Drug use: Not on file    Home Medications Prior to Admission medications   Not on File    Allergies    Patient has no allergy information on record.  Review of Systems   Review of Systems  Unable to perform ROS: Acuity of condition    Physical Exam Updated Vital Signs BP (!) 114/101   Pulse (!) 116   Temp (!) 97 F (36.1 C) (Temporal)   Resp 18   SpO2 99%   Physical Exam Vitals and nursing note reviewed.  Constitutional:      General: He is in acute distress.     Appearance: He is well-developed. He is ill-appearing.     Comments: Unresponsive  HENT:     Head: Normocephalic and atraumatic.     Comments: Very few teeth Eyes:     Conjunctiva/sclera: Conjunctivae normal.     Comments: Right pupil 3 mm, left pupil 5 mm minimal reactivity  Cardiovascular:      Rate and Rhythm: Regular rhythm. Tachycardia present.  Pulmonary:     Comments: Audible breath sounds bilaterally with assisted ventilation Abdominal:     General: There is no distension.  Musculoskeletal:        General: No deformity.  Skin:    General: Skin is warm and dry.  Neurological:     Comments: Unresponsive     ED Results / Procedures / Treatments   Labs (all labs ordered are listed, but only abnormal results are displayed) Labs Reviewed  CBC WITH DIFFERENTIAL/PLATELET - Abnormal; Notable for the following components:      Result Value   WBC 18.2 (*)    Neutro Abs 11.9 (*)    Lymphs Abs 4.6 (*)    Monocytes Absolute 1.1 (*)    Abs Immature Granulocytes 0.35 (*)    All other components within normal limits  PROTIME-INR - Abnormal; Notable for the following components:   INR 1.3 (*)    All other components within normal limits  COMPREHENSIVE METABOLIC PANEL - Abnormal; Notable for the following components:   CO2 14 (*)    Glucose, Bld 273 (*)    Creatinine, Ser 1.33 (*)    Calcium 8.1 (*)    Total Protein 6.2 (*)    Albumin 3.3 (*)    AST  174 (*)    ALT 118 (*)    GFR calc non Af Amer 53 (*)    Anion gap 19 (*)    All other components within normal limits  LIPID PANEL - Abnormal; Notable for the following components:   HDL 33 (*)    LDL Cholesterol 118 (*)    All other components within normal limits  CBC - Abnormal; Notable for the following components:   WBC 21.1 (*)    RBC 5.89 (*)    Hemoglobin 17.9 (*)    All other components within normal limits  BASIC METABOLIC PANEL - Abnormal; Notable for the following components:   CO2 16 (*)    Glucose, Bld 189 (*)    Calcium 8.1 (*)    All other components within normal limits  APTT - Abnormal; Notable for the following components:   aPTT 142 (*)    All other components within normal limits  GLUCOSE, CAPILLARY - Abnormal; Notable for the following components:   Glucose-Capillary 170 (*)    All other  components within normal limits  GLUCOSE, CAPILLARY - Abnormal; Notable for the following components:   Glucose-Capillary 166 (*)    All other components within normal limits  GLUCOSE, CAPILLARY - Abnormal; Notable for the following components:   Glucose-Capillary 166 (*)    All other components within normal limits  GLUCOSE, CAPILLARY - Abnormal; Notable for the following components:   Glucose-Capillary 176 (*)    All other components within normal limits  CBG MONITORING, ED - Abnormal; Notable for the following components:   Glucose-Capillary 254 (*)    All other components within normal limits  POCT I-STAT 7, (LYTES, BLD GAS, ICA,H+H) - Abnormal; Notable for the following components:   pH, Arterial 7.132 (*)    pO2, Arterial 214 (*)    Bicarbonate 15.8 (*)    TCO2 17 (*)    Acid-base deficit 13.0 (*)    Calcium, Ion 1.10 (*)    All other components within normal limits  POCT I-STAT, CHEM 8 - Abnormal; Notable for the following components:   Glucose, Bld 231 (*)    Calcium, Ion 1.11 (*)    TCO2 18 (*)    All other components within normal limits  POCT I-STAT 7, (LYTES, BLD GAS, ICA,H+H) - Abnormal; Notable for the following components:   pH, Arterial 7.236 (*)    pO2, Arterial 425 (*)    Bicarbonate 17.7 (*)    TCO2 19 (*)    Acid-base deficit 10.0 (*)    Hemoglobin 17.7 (*)    All other components within normal limits  POCT I-STAT 7, (LYTES, BLD GAS, ICA,H+H) - Abnormal; Notable for the following components:   pCO2 arterial 24.5 (*)    pO2, Arterial 126 (*)    Bicarbonate 14.9 (*)    TCO2 16 (*)    Acid-base deficit 10.0 (*)    Calcium, Ion 1.11 (*)    Hemoglobin 17.7 (*)    All other components within normal limits  TROPONIN I (HIGH SENSITIVITY) - Abnormal; Notable for the following components:   Troponin I (High Sensitivity) 9,239 (*)    All other components within normal limits  TROPONIN I (HIGH SENSITIVITY) - Abnormal; Notable for the following components:    Troponin I (High Sensitivity) >27,000 (*)    All other components within normal limits  SARS CORONAVIRUS 2 BY RT PCR (HOSPITAL ORDER, [place] [facility])  MRSA PCR SCREENING  HEMOGLOBIN A1C  APTT  PROTIME-INR  BLOOD GAS, ARTERIAL  PROTIME-INR  APTT  BLOOD GAS, ARTERIAL  BLOOD GAS, ARTERIAL  MAGNESIUM  PHOSPHORUS  BLOOD GAS, ARTERIAL    EKG EKG Interpretation  Date/Time:  Wednesday Oct 20 2019 16:38:21 EDT Ventricular Rate:  87 PR Interval:    QRS Duration: 99 QT Interval:  416 QTC Calculation: 501 R Axis:   -64 Text Interpretation: Sinus rhythm Consider inferior infarct Probable anterolateral infarct, acute Prolonged QT interval >>> Acute MI <<< Abnormal ECG Confirmed by Carmin Muskrat [phone]) on 11/01/2019 4:48:07 PM   Radiology CT Head Wo Contrast  Result Date: 11/05/2019 CLINICAL DATA:  Status post CPR EXAM: CT HEAD WITHOUT CONTRAST TECHNIQUE: Contiguous axial images were obtained from the base of the skull through the vertex without intravenous contrast. COMPARISON:  None. FINDINGS: Brain: No evidence of acute infarction, hemorrhage, hydrocephalus, extra-axial collection or mass lesion/mass effect. Vascular: No hyperdense vessel or unexpected calcification. Skull: Normal. Negative for fracture or focal lesion. Sinuses/Orbits: No acute finding. Other: None. IMPRESSION: No acute intracranial abnormality noted. Electronically Signed   By: Inez Catalina M.D.   On: 10/13/2019 17:10   CARDIAC CATHETERIZATION  Result Date: 2/87/8676  LV end diastolic pressure is moderately elevated.  The left ventricular ejection fraction is 35-45% by visual estimate.  Prox RCA lesion is 40% stenosed.  RPAV lesion is 50% stenosed.  RPDA lesion is 75% stenosed.  Mid LM to Dist LM lesion is 40% stenosed.  Ramus lesion is 80% stenosed.  Prox LAD to Mid LAD lesion is 100% stenosed.  A drug-eluting stent was successfully placed using a STENT RESOLUTE ONYX 3.0X22.  Post  intervention, there is a 0% residual stenosis.  1.  Acute total occlusion of the proximal LAD, treated successfully with a 3.0 x 22 mm resolute Onyx DES 2.  Severe stenosis of the ramus intermedius branch extending back to the left main 3.  Patent, small left circumflex 4.  Patent RCA with moderately severe bifurcation disease involving the posterior AV segment and PDA branches 5.  Moderate segmental LV systolic dysfunction with periapical akinesis and LVEF estimated at 40 to 45%, moderately elevated LVEDP consistent with acute systolic heart failure 6.  Successful placement of left internal jugular triple-lumen catheter under fluoroscopic guidance Recommendations: Discussed with critical care team.  Patient should be initiated on hypothermia protocol.  He is loaded with 324 mg of aspirin and 180 mg of ticagrelor in the cardiac catheterization lab.  He should continue on Aggrastat infusion for 6 hours.  Prognosis is guarded after out of hospital cardiac arrest.   DG CHEST PORT 1 VIEW  Result Date: 10/24/2019 CLINICAL DATA:  Status post endotracheal intubation EXAM: PORTABLE CHEST 1 VIEW COMPARISON:  Film from earlier in the same day. FINDINGS: Previously seen endotracheal tube is been withdrawn and now lies 3.4 cm above the carina in satisfactory position. Left jugular central line has been placed in the interval at the cavoatrial junction. Gastric catheter extends into the stomach. Cardiac shadow is within normal limits. No focal infiltrate or effusion is seen. Previously seen upper lobe atelectasis has cleared. No pneumothorax is noted. IMPRESSION: Tubes and lines in satisfactory position. Improved inspiratory effort when compared with the prior exam. Electronically Signed   By: Inez Catalina M.D.   On: 10/18/2019 21:17   DG Chest Port 1 View  Result Date: 10/14/2019 CLINICAL DATA:  Status post intubation. EXAM: PORTABLE CHEST 1 VIEW COMPARISON:  None. FINDINGS: An endotracheal tube is seen with its distal  tip approximately  1.3 cm from the carina. A nasogastric tube is noted with its distal end seen within the body of the stomach. Mildly decreased lung volumes are seen with mild, diffuse chronic appearing increased lung markings. Mild atelectasis is seen along the medial aspect of the right apex. There is no evidence of acute infiltrate, pleural effusion or pneumothorax. The heart size and mediastinal contours are within normal limits. Multilevel degenerative changes are noted throughout the thoracic spine. IMPRESSION: 1. Endotracheal tube and nasogastric tube positioning, as described above. 2. Mild right upper lobe atelectasis. Electronically Signed   By: Virgina Norfolk M.D.   On: 10/22/2019 17:04    Procedures Procedures (including critical care time)  INTUBATION Performed by: Carmin Muskrat  Required items: required blood products, implants, devices, and special equipment available Patient identity confirmed: provided demographic data and hospital-assigned identification number Time out: Immediately prior to procedure a "time out" was called to verify the correct patient, procedure, equipment, support staff and site/side marked as required.  Indications: cardiac arrest  Intubation method: Glidescope Laryngoscopy   Preoxygenation: BVM - via KING  Sedatives: 20 Etomidate Paralytic: 100 rocuronium  Tube Size: 7.5 cuffed  Post-procedure assessment: chest rise and ETCO2 monitor Breath sounds: equal and absent over the epigastrium Tube secured with: ETT holder Chest x-ray interpreted by radiologist and me.  Chest x-ray findings: endotracheal tube in appropriate position  Patient tolerated the procedure well with no immediate complications.  CRITICAL CARE Performed by: Carmin Muskrat Total critical care time: 35 minutes Critical care time was exclusive of separately billable procedures and treating other patients. Critical care was necessary to treat or prevent imminent or  life-threatening deterioration. Critical care was time spent personally by me on the following activities: development of treatment plan with patient and/or surrogate as well as nursing, discussions with consultants, evaluation of patient's response to treatment, examination of patient, obtaining history from patient or surrogate, ordering and performing treatments and interventions, ordering and review of laboratory studies, ordering and review of radiographic studies, pulse oximetry and re-evaluation of patient's condition.    Medications Ordered in ED Medications  0.9 %  sodium chloride infusion ( Intravenous New Bag/Given 10/22/2019 1718)  etomidate (AMIDATE) injection (20 mg Intravenous Given 11/05/2019 1639)  rocuronium (ZEMURON) injection (100 mg Intravenous Given 10/13/2019 1640)  labetalol (NORMODYNE) injection 10 mg (has no administration in time range)  hydrALAZINE (APRESOLINE) injection 10 mg (has no administration in time range)  acetaminophen (TYLENOL) tablet 650 mg (has no administration in time range)  sodium chloride flush (NS) 0.9 % injection 3 mL (3 mLs Intravenous Not Given 10/28/2019 2133)  sodium chloride flush (NS) 0.9 % injection 3 mL (has no administration in time range)  0.9 %  sodium chloride infusion (has no administration in time range)  aspirin chewable tablet 81 mg (has no administration in time range)  ticagrelor (BRILINTA) tablet 90 mg (has no administration in time range)  nitroGLYCERIN (NITROSTAT) SL tablet 0.4 mg (has no administration in time range)  heparin injection 5,000 Units (has no administration in time range)  carvedilol (COREG) tablet 3.125 mg (has no administration in time range)  atorvastatin (LIPITOR) tablet 80 mg (has no administration in time range)  tirofiban (AGGRASTAT) infusion 50 mcg/mL 100 mL (has no administration in time range)  pantoprazole (PROTONIX) injection 40 mg (40 mg Intravenous Not Given 10/12/2019 2029)  chlorhexidine gluconate (MEDLINE  KIT) (PERIDEX) 0.12 % solution 15 mL (15 mLs Mouth Rinse Given 11/01/2019 2037)  MEDLINE mouth rinse (15 mLs Mouth Rinse  Given 10/31/2019 2140)  insulin aspart (novoLOG) injection 0-9 Units (2 Units Subcutaneous Given 10/30/2019 2307)  norepinephrine (LEVOPHED) 34m in 2515mpremix infusion (15 mcg/min Intravenous Rate/Dose Verify 11/04/2019 2300)  cisatracurium (NIMBEX) bolus via infusion 9.1 mg (9.1 mg Intravenous Bolus from Bag 10/21/2019 2044)    And  cisatracurium (NIMBEX) 200 mg in sodium chloride 0.9 % 200 mL (1 mg/mL) infusion (1.5 mcg/kg/min  90.7 kg Intravenous Rate/Dose Verify 10/31/2019 2300)    And  cisatracurium (NIMBEX) bolus via infusion 4.5 mg (has no administration in time range)  artificial tears (LACRILUBE) ophthalmic ointment 1 application (1 application Both Eyes Given 11/02/2019 2159)  fentaNYL (SUBLIMAZE) injection 50 mcg (50 mcg Intravenous Not Given 10/09/2019 2045)  fentaNYL 250026min NS 250m108m0mc19m) infusion-PREMIX (150 mcg/hr Intravenous Rate/Dose Verify 10/15/2019 2300)  fentaNYL (SUBLIMAZE) bolus via infusion 25 mcg (has no administration in time range)  midazolam (VERSED) injection 1 mg (has no administration in time range)  midazolam (VERSED) 50 mg/50 mL (1 mg/mL) premix infusion (2 mg/hr Intravenous Rate/Dose Verify 11/03/2019 2300)  midazolam (VERSED) bolus via infusion 1 mg (has no administration in time range)  tirofiban (AGGRASTAT) infusion 50 mcg/mL 100 mL (0.15 mcg/kg/min  90.7 kg Intravenous New Bag/Given 10/18/2019 1736)  0.9 %  sodium chloride infusion (10 mL/hr Intravenous New Bag/Given 10/18/2019 1718)  magnesium sulfate IVPB 2 g 50 mL (2 g Intravenous New Bag/Given 10/16/2019 2000)  sodium chloride 0.9 % bolus 500 mL (500 mLs Intravenous New Bag/Given 10/11/2019 2322)    ED Course  I have reviewed the triage vital signs and the nursing notes.  Pertinent labs & imaging results that were available during my care of the patient were reviewed by me and considered in my medical decision  making (see chart for details).   Immediately after arrival with concern for his witnessed arrest he was placed on continuous cardiac monitoring, resuscitation continued, endotracheal tube was placed without complication, procedure note above.  On additional exam patient did not have asymmetric pupils, and given his history of sudden arrest, additional pathology including intracranial hemorrhage is considered.  Patient will have CT scan prior to going to the catheterization lab.  Update: I reviewed the patient's CT images, in the radiology viewing both, as the study was being performed. No evidence for acute hemorrhage.  Patient appropriate for transfer to catheterization lab.  This adult male presents after witnessed arrest, now status post return of spontaneous circulation, though with EKG changes concerning for acute ischemic event. Here patient continued to have resuscitation in the emergency department, [facility].  I discussed his case with our cardiology colleagues at bedside, and after the patient had head CT to exclude intracranial hemorrhage he went to the catheterization lab. No charges reviewed screw the patient had intervention with stenting placed in the lab.  Final Clinical Impression(s) / ED Diagnoses Final diagnoses:  Endotracheally intubated  Cardiac arrest (HCC)[facility] LockwCarmin Muskrat05/12/21 2352
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
## 2019-10-20 NOTE — Progress Notes (Signed)
eLink Physician-Brief Progress Note Patient Name: Bradley Rodgers DOB: [birth year] MRN: [id number]   Date of Service  Nov 06, 2019  HPI/Events of Note  ABG on 100%/PRVC 18?TV 450/P 5 = 7.236/40.6/425.  eICU Interventions  Will order: 1. Wean FiO2 as tolerated. Keep sat >= 92%. 2. Increase PRVC rate to 24. 3. Repeat ABG at 11 PM.     Intervention Category Major Interventions: Respiratory failure - evaluation and management;Acid-Base disturbance - evaluation and management  Benedicta Sultan [name] Nov 06, 2019, 9:27 PM

## 2019-10-20 NOTE — Procedures (Signed)
Arterial Catheter Insertion Procedure Note Bradley Rodgers [id number] 1946/01/22  Procedure: Insertion of Arterial Catheter  Indications: Blood pressure monitoring  Procedure Details Consent: Unable to obtain consent because of emergent medical necessity. Time Out: Verified patient identification, verified procedure, site/side was marked, verified correct patient position, special equipment/implants available, medications/allergies/relevent history reviewed, required imaging and test results available.  Performed  Maximum sterile technique was used including antiseptics, cap, gloves, gown, hand hygiene, mask and sheet. Skin prep: Chlorhexidine; local anesthetic administered 20 gauge catheter was inserted into left radial artery using the Seldinger technique. ULTRASOUND GUIDANCE USED: YES Evaluation Blood flow good; BP tracing good. Complications: No apparent complications.   Duayne Cal 10/11/2019

## 2019-10-21 ENCOUNTER — Inpatient Hospital Stay (HOSPITAL_COMMUNITY): Payer: No Typology Code available for payment source

## 2019-10-21 DIAGNOSIS — I2102 ST elevation (STEMI) myocardial infarction involving left anterior descending coronary artery: Principal | ICD-10-CM

## 2019-10-21 DIAGNOSIS — I5021 Acute systolic (congestive) heart failure: Secondary | ICD-10-CM

## 2019-10-21 DIAGNOSIS — J9602 Acute respiratory failure with hypercapnia: Secondary | ICD-10-CM | POA: Diagnosis present

## 2019-10-21 DIAGNOSIS — J9601 Acute respiratory failure with hypoxia: Secondary | ICD-10-CM

## 2019-10-21 DIAGNOSIS — Z978 Presence of other specified devices: Secondary | ICD-10-CM

## 2019-10-21 DIAGNOSIS — R570 Cardiogenic shock: Secondary | ICD-10-CM

## 2019-10-21 DIAGNOSIS — I469 Cardiac arrest, cause unspecified: Secondary | ICD-10-CM

## 2019-10-21 LAB — BASIC METABOLIC PANEL
Anion gap: 9 (ref 5–15)
Anion gap: 13 (ref 5–15)
Anion gap: 11 (ref 5–15)
Anion gap: 12 (ref 5–15)
BUN: 10 mg/dL (ref 8–23)
BUN: 11 mg/dL (ref 8–23)
BUN: 11 mg/dL (ref 8–23)
BUN: 12 mg/dL (ref 8–23)
CO2: 15 mmol/L — ABNORMAL LOW (ref 22–32)
CO2: 14 mmol/L — ABNORMAL LOW (ref 22–32)
CO2: 17 mmol/L — ABNORMAL LOW (ref 22–32)
CO2: 16 mmol/L — ABNORMAL LOW (ref 22–32)
Calcium: 7.8 mg/dL — ABNORMAL LOW (ref 8.9–10.3)
Calcium: 8.2 mg/dL — ABNORMAL LOW (ref 8.9–10.3)
Calcium: 7.9 mg/dL — ABNORMAL LOW (ref 8.9–10.3)
Calcium: 8.1 mg/dL — ABNORMAL LOW (ref 8.9–10.3)
Chloride: 114 mmol/L — ABNORMAL HIGH (ref 98–111)
Chloride: 109 mmol/L (ref 98–111)
Chloride: 111 mmol/L (ref 98–111)
Chloride: 110 mmol/L (ref 98–111)
Creatinine, Ser: 0.76 mg/dL (ref 0.61–1.24)
Creatinine, Ser: 0.77 mg/dL (ref 0.61–1.24)
Creatinine, Ser: 0.92 mg/dL (ref 0.61–1.24)
Creatinine, Ser: 0.89 mg/dL (ref 0.61–1.24)
GFR calc Af Amer: >60 mL/min (ref >60)
GFR calc Af Amer: >60 mL/min (ref >60)
GFR calc Af Amer: >60 mL/min (ref >60)
GFR calc Af Amer: >60 mL/min (ref >60)
GFR calc non Af Amer: >60 mL/min (ref >60)
GFR calc non Af Amer: >60 mL/min (ref >60)
GFR calc non Af Amer: >60 mL/min (ref >60)
GFR calc non Af Amer: >60 mL/min (ref >60)
Glucose, Bld: 156 mg/dL — ABNORMAL HIGH (ref 70–99)
Glucose, Bld: 175 mg/dL — ABNORMAL HIGH (ref 70–99)
Glucose, Bld: 143 mg/dL — ABNORMAL HIGH (ref 70–99)
Glucose, Bld: 132 mg/dL — ABNORMAL HIGH (ref 70–99)
Potassium: 3.1 mmol/L — ABNORMAL LOW (ref 3.5–5.1)
Potassium: 3.7 mmol/L (ref 3.5–5.1)
Potassium: 3.1 mmol/L — ABNORMAL LOW (ref 3.5–5.1)
Potassium: 4.1 mmol/L (ref 3.5–5.1)
Sodium: 138 mmol/L (ref 135–145)
Sodium: 136 mmol/L (ref 135–145)
Sodium: 139 mmol/L (ref 135–145)
Sodium: 138 mmol/L (ref 135–145)

## 2019-10-21 LAB — GLUCOSE, CAPILLARY
Glucose-Capillary: 108 mg/dL — ABNORMAL HIGH (ref 70–99)
Glucose-Capillary: 119 mg/dL — ABNORMAL HIGH (ref 70–99)
Glucose-Capillary: 126 mg/dL — ABNORMAL HIGH (ref 70–99)
Glucose-Capillary: 127 mg/dL — ABNORMAL HIGH (ref 70–99)
Glucose-Capillary: 128 mg/dL — ABNORMAL HIGH (ref 70–99)
Glucose-Capillary: 130 mg/dL — ABNORMAL HIGH (ref 70–99)
Glucose-Capillary: 136 mg/dL — ABNORMAL HIGH (ref 70–99)
Glucose-Capillary: 138 mg/dL — ABNORMAL HIGH (ref 70–99)
Glucose-Capillary: 159 mg/dL — ABNORMAL HIGH (ref 70–99)
Glucose-Capillary: 177 mg/dL — ABNORMAL HIGH (ref 70–99)
Glucose-Capillary: 178 mg/dL — ABNORMAL HIGH (ref 70–99)
Glucose-Capillary: 178 mg/dL — ABNORMAL HIGH (ref 70–99)
Glucose-Capillary: 183 mg/dL — ABNORMAL HIGH (ref 70–99)
Glucose-Capillary: 185 mg/dL — ABNORMAL HIGH (ref 70–99)
Glucose-Capillary: 191 mg/dL — ABNORMAL HIGH (ref 70–99)
Glucose-Capillary: 194 mg/dL — ABNORMAL HIGH (ref 70–99)
Glucose-Capillary: 83 mg/dL (ref 70–99)
Glucose-Capillary: 98 mg/dL (ref 70–99)

## 2019-10-21 LAB — POCT I-STAT 7, (LYTES, BLD GAS, ICA,H+H)
Acid-base deficit: 11 mmol/L — ABNORMAL HIGH (ref 0.0–2.0)
Acid-base deficit: 7 mmol/L — ABNORMAL HIGH (ref 0.0–2.0)
Bicarbonate: 14.4 mmol/L — ABNORMAL LOW (ref 20.0–28.0)
Bicarbonate: 15.7 mmol/L — ABNORMAL LOW (ref 20.0–28.0)
Calcium, Ion: 1.05 mmol/L — ABNORMAL LOW (ref 1.15–1.40)
Calcium, Ion: 1.12 mmol/L — ABNORMAL LOW (ref 1.15–1.40)
HCT: 49 % (ref 39.0–52.0)
HCT: 48 % (ref 39.0–52.0)
Hemoglobin: 16.7 g/dL (ref 13.0–17.0)
Hemoglobin: 16.3 g/dL (ref 13.0–17.0)
O2 Saturation: 99 %
O2 Saturation: 100 %
Patient temperature: 32.8
Patient temperature: 33.5
Potassium: 3.4 mmol/L — ABNORMAL LOW (ref 3.5–5.1)
Potassium: 3.3 mmol/L — ABNORMAL LOW (ref 3.5–5.1)
Sodium: 141 mmol/L (ref 135–145)
Sodium: 141 mmol/L (ref 135–145)
TCO2: 15 mmol/L — ABNORMAL LOW (ref 22–32)
TCO2: 17 mmol/L — ABNORMAL LOW (ref 22–32)
pCO2 arterial: 25.6 mmHg — ABNORMAL LOW (ref 32.0–48.0)
pCO2 arterial: 22.7 mmHg — ABNORMAL LOW (ref 32.0–48.0)
pH, Arterial: 7.337 — ABNORMAL LOW (ref 7.350–7.450)
pH, Arterial: 7.434 (ref 7.350–7.450)
pO2, Arterial: 163 mmHg — ABNORMAL HIGH (ref 83.0–108.0)
pO2, Arterial: 181 mmHg — ABNORMAL HIGH (ref 83.0–108.0)

## 2019-10-21 LAB — PROTIME-INR
INR: 1.2 (ref 0.8–1.2)
Prothrombin Time: 14.8 seconds (ref 11.4–15.2)

## 2019-10-21 LAB — CK TOTAL AND CKMB (NOT AT ARMC)
CK, MB: >500 ng/mL — ABNORMAL HIGH (ref 0.5–5.0)
Total CK: 4065 U/L — ABNORMAL HIGH (ref 49–397)

## 2019-10-21 LAB — ECHOCARDIOGRAM COMPLETE
Height: 73 in
Weight: 3200 oz

## 2019-10-21 LAB — PHOSPHORUS
Phosphorus: <1 mg/dL — CL (ref 2.5–4.6)
Phosphorus: 4.6 mg/dL (ref 2.5–4.6)

## 2019-10-21 LAB — APTT: aPTT: 38 seconds — ABNORMAL HIGH (ref 24–36)

## 2019-10-21 LAB — POCT ACTIVATED CLOTTING TIME: Activated Clotting Time: 268 seconds

## 2019-10-21 LAB — MAGNESIUM: Magnesium: 2.3 mg/dL (ref 1.7–2.4)

## 2019-10-21 LAB — MRSA PCR SCREENING: MRSA by PCR: NEGATIVE

## 2019-10-21 MED ORDER — TICAGRELOR 90 MG PO TABS
90.0000 mg | ORAL_TABLET | Freq: Once | ORAL | Status: AC
  Administered 2019-10-21: 90 mg
  Filled 2019-10-21: qty 1

## 2019-10-21 MED ORDER — SODIUM CHLORIDE 0.9% FLUSH
10.0000 mL | Freq: Two times a day (BID) | INTRAVENOUS | Status: DC
  Administered 2019-10-21 – 2019-10-23 (×3): 10 mL
  Administered 2019-10-23: 20 mL
  Administered 2019-10-23 – 2019-10-24 (×2): 10 mL

## 2019-10-21 MED ORDER — METOCLOPRAMIDE HCL 5 MG/ML IJ SOLN
5.0000 mg | Freq: Four times a day (QID) | INTRAMUSCULAR | Status: DC
  Administered 2019-10-21 – 2019-10-24 (×14): 5 mg via INTRAVENOUS
  Filled 2019-10-21 (×14): qty 2

## 2019-10-21 MED ORDER — POTASSIUM PHOSPHATES 15 MMOLE/5ML IV SOLN
30.0000 mmol | Freq: Once | INTRAVENOUS | Status: AC
  Administered 2019-10-21: 30 mmol via INTRAVENOUS
  Filled 2019-10-21: qty 10

## 2019-10-21 MED ORDER — POTASSIUM CHLORIDE 20 MEQ/15ML (10%) PO SOLN
40.0000 meq | Freq: Once | ORAL | Status: AC
  Administered 2019-10-21: 40 meq via ORAL
  Filled 2019-10-21: qty 30

## 2019-10-21 MED ORDER — SODIUM CHLORIDE 0.9 % IV BOLUS
500.0000 mL | Freq: Once | INTRAVENOUS | Status: AC
  Administered 2019-10-21: 500 mL via INTRAVENOUS

## 2019-10-21 MED ORDER — CALCIUM GLUCONATE-NACL 1-0.675 GM/50ML-% IV SOLN
1.0000 g | Freq: Once | INTRAVENOUS | Status: AC
  Administered 2019-10-21: 1000 mg via INTRAVENOUS
  Filled 2019-10-21: qty 50

## 2019-10-21 MED ORDER — NOREPINEPHRINE 16 MG/250ML-% IV SOLN
0.0000 ug/min | INTRAVENOUS | Status: DC
  Administered 2019-10-21: 5 ug/min via INTRAVENOUS
  Administered 2019-10-22: 18 ug/min via INTRAVENOUS
  Administered 2019-10-23: 2 ug/min via INTRAVENOUS
  Filled 2019-10-21 (×2): qty 250

## 2019-10-21 MED ORDER — TICAGRELOR 90 MG PO TABS
90.0000 mg | ORAL_TABLET | Freq: Two times a day (BID) | ORAL | Status: DC
  Administered 2019-10-21 – 2019-10-24 (×7): 90 mg
  Filled 2019-10-21 (×7): qty 1

## 2019-10-21 MED ORDER — CHLORHEXIDINE GLUCONATE CLOTH 2 % EX PADS
6.0000 | MEDICATED_PAD | Freq: Every day | CUTANEOUS | Status: DC
  Administered 2019-10-20 – 2019-10-23 (×4): 6 via TOPICAL

## 2019-10-21 MED ORDER — ASPIRIN 81 MG PO CHEW
81.0000 mg | CHEWABLE_TABLET | Freq: Every day | ORAL | Status: DC
  Administered 2019-10-22 – 2019-10-24 (×3): 81 mg
  Filled 2019-10-21 (×3): qty 1

## 2019-10-21 MED ORDER — ATORVASTATIN CALCIUM 80 MG PO TABS
80.0000 mg | ORAL_TABLET | Freq: Every day | ORAL | Status: DC
  Administered 2019-10-22 – 2019-10-24 (×3): 80 mg
  Filled 2019-10-21 (×3): qty 1

## 2019-10-21 MED ORDER — SODIUM CHLORIDE 0.9% FLUSH: 10.0000 mL | INTRAVENOUS | Status: DC | PRN

## 2019-10-21 MED ORDER — PANTOPRAZOLE SODIUM 40 MG PO PACK
40.0000 mg | PACK | Freq: Every day | ORAL | Status: DC
  Administered 2019-10-22 – 2019-10-24 (×3): 40 mg
  Filled 2019-10-21 (×3): qty 20

## 2019-10-21 MED FILL — Lidocaine HCl Local Preservative Free (PF) Inj 1%: INTRAMUSCULAR | Qty: 30 | Status: AC

## 2019-10-21 NOTE — Progress Notes (Signed)
Progress Note  Patient Name: Bradley Rodgers Date of Encounter: 10/21/2019  Primary Cardiologist: No primary care provider on file.  Dr. Burt Knack   Patient Profile     Bradley Rodgers is a 74 y.o. male with unknown past medical history, presenting with out of hospital cardiac arrest.  He suddenly collapsed and became unconscious while dropping off a friend.  Bystander CPR was immediately initiated and EMS called.  Upon EMS arrival patient received total of 3 rounds of epinephrine and 1 defibrillation restoring sinus rhythm after brief period of PEA turn into V. tach.  Emergent airway was exchanged in the ER with an ET tube.  Was hemodynamically stable.  EKG demonstrated significant anterior lateral ST elevations and the patient was brought emergently to the cardiac catheterization lab.  Principal Problem:   Cardiac arrest [facility]) Active Problems:   Acute respiratory failure with hypoxia and hypercapnia (HCC)   Cardiogenic shock (HCC)   STEMI involving left anterior descending coronary artery ([place])   Endotracheally intubated \  Subjective   Remains intubated sedated on cooling protocol.  Requiring intermittent escalation de-escalation dose of Levophed for maintaining MAP 70. Cisatracurium just turned off, and cooling temperature increased due to instability.  Inpatient Medications    Scheduled Meds: . artificial tears  1 application Both Eyes C9O  . [START ON 10/22/2019] aspirin  81 mg Per Tube Daily  . [START ON 10/22/2019] atorvastatin  80 mg Per Tube Daily  . chlorhexidine gluconate (MEDLINE KIT)  15 mL Mouth Rinse BID  . Chlorhexidine Gluconate Cloth  6 each Topical Daily  . fentaNYL (SUBLIMAZE) injection  50 mcg Intravenous Once  . heparin  5,000 Units Subcutaneous Q8H  . insulin aspart  0-9 Units Subcutaneous Q4H  . mouth rinse  15 mL Mouth Rinse 10 times per day  . metoCLOPramide (REGLAN) injection  5 mg Intravenous Q6H  . midazolam  1 mg Intravenous Once  . [START ON  10/22/2019] pantoprazole sodium  40 mg Per Tube Daily  . sodium chloride flush  10-40 mL Intracatheter Q12H  . sodium chloride flush  3 mL Intravenous Q12H  . ticagrelor  90 mg Per Tube BID   Continuous Infusions: . sodium chloride 10 mL/hr at 10/28/2019 1718  . sodium chloride    . fentaNYL infusion INTRAVENOUS 100 mcg/hr (10/21/19 1800)  . midazolam 2 mg/hr (10/21/19 1800)  . norepinephrine (LEVOPHED) Adult infusion 6 mcg/min (10/21/19 1800)   PRN Meds: sodium chloride, acetaminophen, etomidate, fentaNYL, midazolam, nitroGLYCERIN, sodium chloride flush, sodium chloride flush   Vital Signs    Vitals:   10/21/19 1815 10/21/19 1830 10/21/19 1845 10/21/19 1936  BP:      Pulse:      Resp:      Temp:      TempSrc:      SpO2: 100% 100% 100% 100%  Weight:      Height:        Intake/Output Summary (Last 24 hours) at 10/21/2019 1950 Last data filed at 10/21/2019 1800 Gross per 24 hour  Intake 1464.08 ml  Output 1620 ml  Net -155.92 ml   Last 3 Weights 10/19/2019 11/05/2019  Weight (lbs) 200 lb 198 lb  Weight (kg) 90.719 kg 89.812 kg      Telemetry    Sinus rhythm/bradycardia with frequent ectopy- Personally Reviewed  ECG    Sinus rhythm with frequent Premature ventricular complexes rate 67. Anteroseptal infarct , age undetermined ST & T wave abnormality, consider lateral ischemia -> apparent evolutionary changes of  anterior MI. Abnormal ECG - Personally Reviewed  Physical Exam   GEN:  Intubated, sedated, paralytics just turned off HEENT: -> Pupils are unequal, left side is dilated right not. ->  Eyes do open and close potentially touch. Neck: No JVD; left-sided central line in place. Cardiac: RRR, no murmurs, rubs, or gallops.  Respiratory:  Coarse diffuse breath sounds from ETT, but no obvious rales or rhonchi. GI: Soft, nontender, non-distended  MS: No edema; No deformity. Neuro:   Intubated and sedated   Labs    High Sensitivity Troponin:   Recent Labs  Lab  10/30/2019 1645 11/01/2019 2010  TROPONINIHS 9,239* >27,000*      Chemistry Recent Labs  Lab 10/27/2019 1645 10/27/2019 1727 10/21/19 0319 10/21/19 0909 10/21/19 1357  NA 140   < > 138 136 139  K 3.7   < > 3.1* 3.7 3.1*  CL 107   < > 114* 109 111  CO2 14*   < > 15* 14* 17*  GLUCOSE 273*   < > 156* 175* 143*  BUN 10   < > _0 CREATININE 1.33*   < > 0.76 0.77 0.92  CALCIUM 8.1*   < > 7.8* 8.2* 7.9*  PROT 6.2*  --   --   --   --   ALBUMIN 3.3*  --   --   --   --   AST 174*  --   --   --   --   ALT 118*  --   --   --   --   ALKPHOS 60  --   --   --   --   BILITOT 1.1  --   --   --   --   GFRNONAA 53*   < > >60 >60 >60  GFRAA >60   < > >60 >60 >60  ANIONGAP 19*   < > _1 < > = values in this interval not displayed.     Hematology Recent Labs  Lab 11/01/2019 1645 10/30/2019 1727 10/22/2019 2010 10/24/2019 2100 10/21/19 0101 10/21/19 0112 10/21/19 0303  WBC 18.2*  --  21.1*  --   --   --   --   RBC 5.48  --  5.89*  --   --   --   --   HGB 16.7   < > 17.9*   < > 17.0 16.7 16.3  HCT 50.7   < > 52.0   < > 50.0 49.0 48.0  MCV 92.5  --  88.3  --   --   --   --   MCH 30.5  --  30.4  --   --   --   --   MCHC 32.9  --  34.4  --   --   --   --   RDW 13.1  --  13.2  --   --   --   --   PLT 253  --  291  --   --   --   --    < > = values in this interval not displayed.    BNPNo results for input(s): BNP, PROBNP in the last 168 hours.   DDimer No results for input(s): DDIMER in the last 168 hours.   Radiology    CT Head Wo Contrast  Result Date: 10/17/2019 CLINICAL DATA:  Status post CPR EXAM: CT HEAD WITHOUT CONTRAST TECHNIQUE: Contiguous axial images were obtained from the base of  the skull through the vertex without intravenous contrast. COMPARISON:  None. FINDINGS: Brain: No evidence of acute infarction, hemorrhage, hydrocephalus, extra-axial collection or mass lesion/mass effect. Vascular: No hyperdense vessel or unexpected calcification. Skull: Normal. Negative for  fracture or focal lesion. Sinuses/Orbits: No acute finding. Other: None. IMPRESSION: No acute intracranial abnormality noted. Electronically Signed   By: Inez Catalina M.D.   On: 11/03/2019 17:10   CARDIAC CATHETERIZATION  Result Date: 5/42/7062  LV end diastolic pressure is moderately elevated.  The left ventricular ejection fraction is 35-45% by visual estimate.  Prox RCA lesion is 40% stenosed.  RPAV lesion is 50% stenosed.  RPDA lesion is 75% stenosed.  Mid LM to Dist LM lesion is 40% stenosed.  Ramus lesion is 80% stenosed.  Prox LAD to Mid LAD lesion is 100% stenosed.  A drug-eluting stent was successfully placed using a STENT RESOLUTE ONYX 3.0X22.  Post intervention, there is a 0% residual stenosis.  1.  Acute total occlusion of the proximal LAD, treated successfully with a 3.0 x 22 mm resolute Onyx DES 2.  Severe stenosis of the ramus intermedius branch extending back to the left main 3.  Patent, small left circumflex 4.  Patent RCA with moderately severe bifurcation disease involving the posterior AV segment and PDA branches 5.  Moderate segmental LV systolic dysfunction with periapical akinesis and LVEF estimated at 40 to 45%, moderately elevated LVEDP consistent with acute systolic heart failure 6.  Successful placement of left internal jugular triple-lumen catheter under fluoroscopic guidance Recommendations: Discussed with critical care team.  Patient should be initiated on hypothermia protocol.  He is loaded with 324 mg of aspirin and 180 mg of ticagrelor in the cardiac catheterization lab.  He should continue on Aggrastat infusion for 6 hours.  Prognosis is guarded after out of hospital cardiac arrest.   DG CHEST PORT 1 VIEW  Result Date: 10/30/2019 CLINICAL DATA:  Status post endotracheal intubation EXAM: PORTABLE CHEST 1 VIEW COMPARISON:  Film from earlier in the same day. FINDINGS: Previously seen endotracheal tube is been withdrawn and now lies 3.4 cm above the carina in  satisfactory position. Left jugular central line has been placed in the interval at the cavoatrial junction. Gastric catheter extends into the stomach. Cardiac shadow is within normal limits. No focal infiltrate or effusion is seen. Previously seen upper lobe atelectasis has cleared. No pneumothorax is noted. IMPRESSION: Tubes and lines in satisfactory position. Improved inspiratory effort when compared with the prior exam. Electronically Signed   By: Inez Catalina M.D.   On: 10/14/2019 21:17   DG Chest Port 1 View  Result Date: 11/05/2019 CLINICAL DATA:  Status post intubation. EXAM: PORTABLE CHEST 1 VIEW COMPARISON:  None. FINDINGS: An endotracheal tube is seen with its distal tip approximately 1.3 cm from the carina. A nasogastric tube is noted with its distal end seen within the body of the stomach. Mildly decreased lung volumes are seen with mild, diffuse chronic appearing increased lung markings. Mild atelectasis is seen along the medial aspect of the right apex. There is no evidence of acute infiltrate, pleural effusion or pneumothorax. The heart size and mediastinal contours are within normal limits. Multilevel degenerative changes are noted throughout the thoracic spine. IMPRESSION: 1. Endotracheal tube and nasogastric tube positioning, as described above. 2. Mild right upper lobe atelectasis. Electronically Signed   By: Virgina Norfolk M.D.   On: 10/31/2019 17:04   EEG adult  Result Date: 10/21/2019 Lora Havens, MD     10/21/2019  8:59 AM Patient Name: Bradley Rodgers MRN: [id number] Epilepsy Attending: Lora Havens Referring Physician/Provider: Jennelle Human, NP Date: 10/23/2019 Duration: 21.04 mins Patient history: 74yo M s/p cardiac arrest on TTM. EEG to evaluate for seizure. Level of alertness: comatose AEDs during EEG study: versed Technical aspects: This EEG study was done with scalp electrodes positioned according to the 10-20 International system of electrode placement. Electrical  activity was acquired at a sampling rate of _0  and reviewed with a high frequency filter of _1  and a low frequency filter of _2 . EEG data were recorded continuously and digitally stored. DESCRIPTION: EEG showed continuous generalized background attenuation as well as intermittent generalized 3-_3  theta-delta slowing. EEG was reactive to tactile stimulation. Hyperventilation and photic stimulation were not performed. ABNORMALITY - Background attenuation, generalized - Intermittent slow, generalized IMPRESSION: This study is suggestive of profound diffuse encephalopathy, non specific to etiology. No seizures or epileptiform discharges were seen throughout the recording. Priyanka Barbra Sarks   Overnight EEG with video  Result Date: 10/21/2019 Lora Havens, MD     10/21/2019  9:37 AM Patient Name: Bradley Rodgers MRN: [id number] Epilepsy Attending: Lora Havens Referring Physician/Provider: Jennelle Human, NP Duration: 10/28/2019 2206 to 10/21/2019 0930  Patient history: 74yo M s/p cardiac arrest on TTM. EEG to evaluate for seizure.  Level of alertness: comatose  AEDs during EEG study: versed  Technical aspects: This EEG study was done with scalp electrodes positioned according to the 10-20 International system of electrode placement. Electrical activity was acquired at a sampling rate of _4  and reviewed with a high frequency filter of _5  and a low frequency filter of _6 . EEG data were recorded continuously and digitally stored.  DESCRIPTION: EEG showed continuous generalized background attenuation lasting 3-5 seconds. Intermittent generalized 3-_7  theta-delta slowing was also noted which at times appeared sharply contoured. EEG was reactive to tactile stimulation. Hyperventilation and photic stimulation were not performed.  ABNORMALITY - Background attenuation, generalized - Intermittent slow, generalized  IMPRESSION: This study is suggestive of profound diffuse encephalopathy, non specific to  etiology. No seizures or epileptiform discharges were seen throughout the recording. Lora Havens   ECHOCARDIOGRAM COMPLETE  Result Date: 10/21/2019    ECHOCARDIOGRAM REPORT   Patient Name:   Bradley Rodgers Date of Exam: 10/21/2019 Medical Rec #:  [id number]        Height:       73.0 in Accession #:    [id number]       Weight:       200.0 lb Date of Birth:  Sep 26, 1945        BSA:          2.152 m Patient Age:    30 years         BP:           99/70 mmHg Patient Gender: M                HR:           59 bpm. Exam Location:  Inpatient Procedure: 2D Echo Indications:    acute systolic chf 102.72  History:        Patient has no prior history of Echocardiogram examinations.                 Cardiac arrest; CAD.  Sonographer:    Johny Chess Referring Phys: [phone] MICHAEL COOPER  Sonographer Comments: Echo performed with patient supine and on artificial respirator. IMPRESSIONS  1. Left ventricular ejection fraction, by estimation, is  35 to 40%. The left ventricle has moderately decreased function. The left ventricle demonstrates regional wall motion abnormalities (see scoring diagram/findings for description). Left ventricular  diastolic parameters are consistent with Grade I diastolic dysfunction (impaired relaxation).  2. Right ventricular systolic function is mildly reduced. The right ventricular size is normal. There is normal pulmonary artery systolic pressure.  3. The mitral valve is normal in structure. Trivial mitral valve regurgitation. No evidence of mitral stenosis.  4. The aortic valve is tricuspid. Aortic valve regurgitation is not visualized. No aortic stenosis is present. Comparison(s): No prior Echocardiogram. Conclusion(s)/Recommendation(s): LV dysfunction with regional wall motion abnormalities as noted. FINDINGS  Left Ventricle: Left ventricular ejection fraction, by estimation, is 35 to 40%. The left ventricle has moderately decreased function. The left ventricle demonstrates regional wall  motion abnormalities. The left ventricular internal cavity size was normal in size. There is no left ventricular hypertrophy. Left ventricular diastolic parameters are consistent with Grade I diastolic dysfunction (impaired relaxation).  LV Wall Scoring: The mid and distal anterior wall, mid and distal anterior septum, apical inferior segment, and apex are akinetic. The basal anteroseptal segment, apical lateral segment, mid inferoseptal segment, basal anterior segment, and mid inferior segment are hypokinetic. The antero-lateral wall, posterior wall, basal inferior segment, and basal inferoseptal segment are normal. Right Ventricle: The right ventricular size is normal. No increase in right ventricular wall thickness. Right ventricular systolic function is mildly reduced. There is normal pulmonary artery systolic pressure. The tricuspid regurgitant velocity is 2.04 m/s, and with an assumed right atrial pressure of 8 mmHg, the estimated right ventricular systolic pressure is 16.1 mmHg. Left Atrium: Left atrial size was normal in size. Right Atrium: Right atrial size was normal in size. Pericardium: A small pericardial effusion is present. Mitral Valve: The mitral valve is normal in structure. There is mild thickening of the mitral valve leaflet(s). Trivial mitral valve regurgitation. No evidence of mitral valve stenosis. Tricuspid Valve: The tricuspid valve is normal in structure. Tricuspid valve regurgitation is mild . No evidence of tricuspid stenosis. Aortic Valve: The aortic valve is tricuspid. Aortic valve regurgitation is not visualized. No aortic stenosis is present. Mild aortic valve annular calcification. Pulmonic Valve: The pulmonic valve was not well visualized. Pulmonic valve regurgitation is not visualized. No evidence of pulmonic stenosis. Aorta: The aortic root is normal in size and structure. Venous: IVC assessment for right atrial pressure unable to be performed due to mechanical ventilation.  IAS/Shunts: No atrial level shunt detected by color flow Doppler.  LEFT VENTRICLE PLAX 2D LVIDd:         4.90 cm      Diastology LVIDs:         3.90 cm      LV e' lateral:   4.90 cm/s LV PW:         1.10 cm      LV E/e' lateral: 8.7 LV IVS:        0.90 cm      LV e' medial:    3.70 cm/s LVOT diam:     1.80 cm      LV E/e' medial:  11.5 LV SV:         48 LV SV Index:   22 LVOT Area:     2.54 cm  LV Volumes (MOD) LV vol d, MOD A2C: 96.0 ml LV vol d, MOD A4C: 100.0 ml LV vol s, MOD A2C: 63.9 ml LV vol s, MOD A4C: 55.4 ml LV SV MOD A2C:  32.1 ml LV SV MOD A4C:     100.0 ml LV SV MOD BP:      37.9 ml RIGHT VENTRICLE RV S prime:     12.30 cm/s TAPSE (M-mode): 1.4 cm LEFT ATRIUM             Index       RIGHT ATRIUM           Index LA diam:        3.30 cm 1.53 cm/m  RA Area:     14.60 cm LA Vol (A2C):   40.4 ml 18.78 ml/m RA Volume:   36.10 ml  16.78 ml/m LA Vol (A4C):   50.0 ml 23.24 ml/m LA Biplane Vol: 45.5 ml 21.15 ml/m  AORTIC VALVE LVOT Vmax:   90.10 cm/s LVOT Vmean:  57.300 cm/s LVOT VTI:    0.187 m  AORTA Ao Root diam: 2.80 cm MITRAL VALVE               TRICUSPID VALVE MV Area (PHT): 2.37 cm    TR Peak grad:   16.6 mmHg MV Decel Time: 320 msec    TR Vmax:        204.00 cm/s MV E velocity: 42.40 cm/s MV A velocity: 52.30 cm/s  SHUNTS MV E/A ratio:  0.81        Systemic VTI:  0.19 m                            Systemic Diam: 1.80 cm Buford Dresser MD Electronically signed by Buford Dresser MD Signature Date/Time: 10/21/2019/4:10:04 PM    Final     Cardiac Studies   10/13/2019  CAD- PCI: Culprit - 100%prox LAD --> Resolute Onyx DES 3 x 22.  Severe ostial-proximal RI 80% disease extending back to LM.  LM 40%.  Patent small LCx.  Patent RCA-proximal 40%, RPA V 50%, RPDA 75%.   Initiated hypothermia protocol.  Guarded prognosis.  2D echo: EF 35-40%.  Moderately decreased.  GR 1 DD.  Mid and distal anterior wall, mid and distal anterior septum, apical inferior and apex akinetic.  Basal  anteroseptal segment mid apical lateral mid inferoseptal, basal anterior segment and mid inferior segments-hypokinetic.  Anterolateral wall posterior wall basal inferior segment and basal inferoseptal wall normal.  Mildly reduced RV function.  Normal PA pressures.  Relatively normal valves.    Assessment & Plan    Principal Problem:   Cardiac arrest [facility]) Active Problems:   Acute respiratory failure with hypoxia and hypercapnia (HCC)   Cardiogenic shock (HCC)   STEMI involving left anterior descending coronary artery (HCC)   Endotracheally intubated  Status post LAD PCI with Dr. Burt Knack last night.  Excellent results.  Significantly reduced EF on echo not unexpected with anterior ST elevation MI-LAD involvement.  Currently remains hypotensive on cooling protocol requiring intermittent dose adjustment of Levophed. ->  At this time no wound for guideline-directed medical therapy including beta-blocker, ARB/Entresto, spironolactone or diuretic.  He is on aspirin and Brilinta per tube along with high-dose atorvastatin.  For now watchful waiting during the end of cooling and rewarming.  I did spend 10 minutes talking to the patient's daughter answering questions and explaining the situation.  Clearly guarded condition with out of hospital arrest and large anterior MI.  Pending restoration of stable blood vital signs after rewarming, would gradually initiate guideline directed medical management.  Cardiology standing by.   For questions or updates, please  contact [place] Please consult [email or web address] for contact info under        Signed, Glenetta Hew, MD  10/21/2019, 7:50 PM

## 2019-10-21 NOTE — Procedures (Signed)
Patient Name: Lyrick Lagrand  MRN: [id number]  Epilepsy Attending: Charlsie Quest  Referring Physician/Provider: Selmer Dominion, NP Date: Oct 31, 2019 Duration: 21.04 mins  Patient history: 74yo M s/p cardiac arrest on TTM. EEG to evaluate for seizure.  Level of alertness: comatose  AEDs during EEG study: versed  Technical aspects: This EEG study was done with scalp electrodes positioned according to the 10-20 International system of electrode placement. Electrical activity was acquired at a sampling rate of 500Hz  and reviewed with a high frequency filter of 70Hz  and a low frequency filter of 1Hz . EEG data were recorded continuously and digitally stored.   DESCRIPTION: EEG showed continuous generalized background attenuation as well as intermittent generalized 3-5hz  theta-delta slowing. EEG was reactive to tactile stimulation. Hyperventilation and photic stimulation were not performed.  ABNORMALITY  - Background attenuation, generalized  - Intermittent slow, generalized   IMPRESSION: This study is suggestive of profound diffuse encephalopathy, non specific to etiology. No seizures or epileptiform discharges were seen throughout the recording.  Argie Lober 

## 2019-10-21 NOTE — Progress Notes (Signed)
NAME:  Bradley Rodgers, MRN:  [id number], DOB:  1946/04/13, LOS: 1 ADMISSION DATE:  10/26/2019, CONSULTATION DATE:  10/19/2019 REFERRING MD:  Dr. Excell Seltzer, CHIEF COMPLAINT:  Cardiac Arrest  Brief History   74 year old male with unknown history presenting with out of hospital cardiac arrest with bystander CPR, ROSC after 3 rounds ACLS, found to have anterolateral STEMI s/p PCI and DES to LAD.  To ICU for TTM.   Past Medical History  Unknown  Significant Hospital Events   5/12 Admit post arrest  Consults:  PCCM  Procedures:  ETT 5/12 >> L IJ CVL 5/12 >> L Radial Aline 5/12 >>  Significant Diagnostic Tests:  5/12 LHC >>   LV end diastolic pressure is moderately elevated.  The left ventricular ejection fraction is 35-45% by visual estimate.  Prox RCA lesion is 40% stenosed.  RPAV lesion is 50% stenosed.  RPDA lesion is 75% stenosed.  Mid LM to Dist LM lesion is 40% stenosed.  Ramus lesion is 80% stenosed.  Prox LAD to Mid LAD lesion is 100% stenosed.  A drug-eluting stent was successfully placed using a STENT RESOLUTE ONYX 3.0X22.  Post intervention, there is a 0% residual stenosis.   1.  Acute total occlusion of the proximal LAD, treated successfully with a 3.0 x 22 mm resolute Onyx DES 2.  Severe stenosis of the ramus intermedius branch extending back to the left main 3.  Patent, small left circumflex 4.  Patent RCA with moderately severe bifurcation disease involving the posterior AV segment and PDA branches 5.  Moderate segmental LV systolic dysfunction with periapical akinesis and LVEF estimated at 40 to 45%, moderately elevated LVEDP consistent with acute systolic heart failure 6.  Successful placement of left internal jugular triple-lumen catheter under fluoroscopic guidance  5/12 CTH >> No acute intracranial abnormality noted.  5/13 EEG >>   Micro Data:  COVID 5/12 >> negative MRSA PCR 5/12 >> negative  Antimicrobials:    Interim history/subjective:    On vent, 60% FiO2, 5 PEEP Glucose range 136-194 I/O 1L UOP, - in last 24 hours RN reports episode of bradycardia to the 40's, pause with hypotension  On levophed at 8 mcg's KPhos being replaced  Objective   Blood pressure 100/79, pulse 98, temperature (!) 91.2 F (32.9 C), temperature source Bladder, resp. rate (!) 24, height 6\' 1"  (1.854 m), weight 90.7 kg, SpO2 100 %. CVP:  [5 mmHg-12 mmHg] 5 mmHg  Vent Mode: PRVC FiO2 (%):  [60 %-100 %] 60 % Set Rate:  [18 bmp-24 bmp] 24 bmp Vt Set:  [480 mL] 480 mL PEEP:  [5 cmH20] 5 cmH20 Plateau Pressure:  [11 cmH20-13 cmH20] 13 cmH20   Intake/Output Summary (Last 24 hours) at 10/21/2019 0818 Last data filed at 10/21/2019 0700 Gross per 24 hour  Intake 624.02 ml  Output 1430 ml  Net -805.98 ml   Filed Weights   10/29/2019 1648 10/29/2019 1714  Weight: 89.8 kg 90.7 kg    Examination: General: elderly adult male lying in bed in NAD on vent, critically ill appearing HEENT: MM pink/moist, ETT, R pupil round 25mm, left pupil slanted oval 7mm (appears surgical) Neuro: sedate  CV: s1s2 RRR, SR 60's on monitor, no m/r/g PULM:  Non-labored on vent, lungs bilaterally clear GI: soft, bsx4 active  Extremities: warm/dry, no edema  Skin: no rashes or lesions.  Multiple moles/skin tags on face / neck  Resolved Hospital Problem list     Assessment & Plan:   Cardiac Arrest  Acute Anterolateral STEMI s/p PCI with DES to LAD Bradycardia with Pauses (5/13 am) Witnessed arrest and he received bystander CPR followed by 3mg  of epi and one defibrillation in the ED before ROSC was achieved.  LHC PCI with DES to LAD. TTM post cath lab -continue TTM, may need to change to 36 degrees if further sustained brady / pauses -follow CVP, troponin, lactate -appreciate Cardiology assistance with patient care -continue ASA, lipitor -coreg held this am with bradycardia  -await ECHO -monitor tele closely   Acute Hypoxic Respiratory Failure  In the setting of  an acute STEMI   -PRVC 8cc/kg -continue rate 24 with metabolic acidosis  -wean PEEP / FiO2 for sats >90% -VAP prevention measures  -follow intermittent CXR -No SBT until off NMB -Follow cultures   Acute Metabolic Encephalopathy, At Risk for Anoxic Encephalopathy. CT head negative  -RASS goal -5 with NMB -sedation per TTM protocol  -EEG in progress -may need neurology input once off TTM pending exam   At risk for multiple metabolic derangements during cooling Hypokalemia Hypophosphatemia  -NS at 31ml/hr -follow Q4 BMP  -replace electrolytes as indicated     At risk for hyperglycemia during cooling. -SSI   Leukocytosis WBC 18.2 on admission, likely reactive, no acute signs of infection currently  -follow CBC, fever curve  Best practice:  Diet: NPO Pain/Anxiety/Delirium protocol (if indicated): fentanyl/ Versed, NMB VAP protocol (if indicated): yes DVT prophylaxis: heparin/ SCD GI prophylaxis: PPI Glucose control: CBG q 4 Mobility: BR Code Status: Full  Family Communication: will update niece on arrival 5/13 Disposition: ICU   Labs   CBC: Recent Labs  Lab 10/18/2019 1645 10/28/2019 1727 11/03/2019 2010 10/16/2019 2010 10/13/2019 2100 10/28/2019 2306 10/21/19 0101 10/21/19 0112 10/21/19 0303  WBC 18.2*  --  21.1*  --   --   --   --   --   --   NEUTROABS 11.9*  --   --   --   --   --   --   --   --   HGB 16.7   < > 17.9*   < > 17.7* 17.7* 17.0 16.7 16.3  HCT 50.7   < > 52.0   < > 52.0 52.0 50.0 49.0 48.0  MCV 92.5  --  88.3  --   --   --   --   --   --   PLT 253  --  291  --   --   --   --   --   --    < > = values in this interval not displayed.    Basic Metabolic Panel: Recent Labs  Lab 10/11/2019 1645 11/08/2019 1645 11/01/2019 1727 11/01/2019 1727 11/07/2019 2010 10/31/2019 2100 10/17/2019 2306 10/21/19 0101 10/21/19 0112 10/21/19 0303 10/21/19 0319  NA 140   < > 139  140   < > 139   < > 139 134* 141 141 138  K 3.7   < > 3.7  3.7   < > 3.8   < > 3.8 >8.5* 3.4*  3.3* 3.1*  CL 107  --  107  --  110  --   --   --   --   --  114*  CO2 14*  --   --   --  16*  --   --   --   --   --  15*  GLUCOSE 273*  --  231*  --  189*  --   --   --   --   --  156*  BUN 10  --  11  --  11  --   --   --   --   --  10  CREATININE 1.33*  --  0.70  --  1.11  --   --   --   --   --  0.76  CALCIUM 8.1*  --   --   --  8.1*  --   --   --   --   --  7.8*  MG  --   --   --   --   --   --   --   --   --   --  2.3  PHOS  --   --   --   --   --   --   --   --   --   --  <1.0*   < > = values in this interval not displayed.   GFR: Estimated Creatinine Clearance: 92.9 mL/min (by C-G formula based on SCr of 0.76 mg/dL). Recent Labs  Lab 11/05/2019 1645 11/07/2019 2010  WBC 18.2* 21.1*    Liver Function Tests: Recent Labs  Lab 10/21/2019 1645  AST 174*  ALT 118*  ALKPHOS 60  BILITOT 1.1  PROT 6.2*  ALBUMIN 3.3*   No results for input(s): LIPASE, AMYLASE in the last 168 hours. No results for input(s): AMMONIA in the last 168 hours.  ABG    Component Value Date/Time   PHART 7.434 10/21/2019 0303   PCO2ART 22.7 (L) 10/21/2019 0303   PO2ART 181 (H) 10/21/2019 0303   HCO3 15.7 (L) 10/21/2019 0303   TCO2 17 (L) 10/21/2019 0303   ACIDBASEDEF 7.0 (H) 10/21/2019 0303   O2SAT 100.0 10/21/2019 0303     Coagulation Profile: Recent Labs  Lab 10/18/2019 1645 10/24/2019 2010 10/21/19 0319  INR 1.3* 1.2 1.2    Cardiac Enzymes: No results for input(s): CKTOTAL, CKMB, CKMBINDEX, TROPONINI in the last 168 hours.  HbA1C: Hgb A1c MFr Bld  Date/Time Value Ref Range Status  10/17/2019 04:45 PM 5.2 4.8 - 5.6 % Final    Comment:    (NOTE) Pre diabetes:          5.7%-6.4% Diabetes:              >6.4% Glycemic control for   <7.0% adults with diabetes     CBG: Recent Labs  Lab 10/21/19 0100 10/21/19 0200 10/21/19 0301 10/21/19 0458 10/21/19 0655  GLUCAP 185* 178* 159* 128* 136*      CCT: 35 minutes   Canary Brim, MSN, NP-C  Pulmonary & Critical  Care 10/21/2019, 8:18 AM   Please see [email or web address] for pager details.

## 2019-10-21 NOTE — Progress Notes (Signed)
CSW spoke with patients sister Alvino Chapel to confirm where patient is from.When patient was found he was at a friends house.  Patient lives in an apartment.   TOC team will continue to follow for patient needs.

## 2019-10-21 NOTE — Progress Notes (Signed)
eLink Physician-Brief Progress Note Patient Name: Kelsey Edman DOB: Jun 25, 1945 MRN: [id number]   Date of Service  10/21/2019  HPI/Events of Note  Oliguria - Bladder scan with no residual and Foley patent when irrigated. CVP = 12.   eICU Interventions  Will order: 1. Bolus with 500 mL IV over 30 minutes now.      Intervention Category Major Interventions: Other:  [name] 10/21/2019, 12:33 AM

## 2019-10-21 NOTE — Progress Notes (Signed)
Initial Nutrition Assessment  DOCUMENTATION CODES:   Not applicable  INTERVENTION:   Tube feeding:  -Vital 1.5 @ 20 ml/hr via OGT -Increase per toleration to goal rate of 45 ml/hr (1080 ml) -30 ml Prostat QID  At goal TF provides: 2020 kcals, 133 grams protein, 825 ml free water.   Monitor magnesium, potassium, and phosphorus daily for at least 3 days, MD to replete as needed, as pt is at risk for refeeding syndrome.  NUTRITION DIAGNOSIS:   Increased nutrient needs related to acute illness as evidenced by estimated needs.  GOAL:   Patient will meet greater than or equal to 90% of their needs  MONITOR:   Diet advancement, Vent status, Skin, TF tolerance, Weight trends, Labs, I & O's  REASON FOR ASSESSMENT:   Ventilator    ASSESSMENT:   Patient with unknown PMH. Presents this admission after cardiac arrest. Found to have anterior STEMI.Marland Kitchen   5/12- s/p LHC PCI and DES to LAD  On TTM 33. Requiring pressors. Per RN,  Pt had 400 ml output via OGT. Resembled undigested medications. Reglan started. Will discuss possible trickle feeds today with CCM.   Records lack weight history. Utilize 89.8 kg as EDW for now.   Patient is currently intubated on ventilator support MV: 11.1 L/min Temp (24hrs), Avg:92.6 F (33.7 C), Min:90 F (32.2 C), Max:97 F (36.1 C)   I/O: -461 ml since admit  UOP: 1,030 ml x 24 hrs  OGT: 400 ml x 24 hrs   Drips: levophed, potassium phosphate Medications: SS novolog, 5 mg reglan QID,  Labs: CBG 136-183  Diet Order:   Diet Order            Diet NPO time specified  Diet effective now              EDUCATION NEEDS:   Not appropriate for education at this time  Skin:  Skin Assessment: Reviewed RN Assessment  Last BM:  PTA  Height:   Ht Readings from Last 1 Encounters:  10/19/2019 6\' 1"  (1.854 m)    Weight:   Wt Readings from Last 1 Encounters:  11/02/2019 90.7 kg   BMI:  Body mass index is 26.39 kg/m.  Estimated Nutritional  Needs:   Kcal:  1873 kcal  Protein:  130-145 grams  Fluid:  >/= 1.8 L/day   12/20/19 RD, LDN Clinical Nutrition Pager listed in AMION

## 2019-10-21 NOTE — Progress Notes (Signed)
eLink Physician-Brief Progress Note Patient Name: Bradley Rodgers DOB: Feb 26, 1946 MRN: [id number]   Date of Service  10/21/2019  HPI/Events of Note  Multiple issues: K+ = 3.1, PO4--- < 1.0 and Creatinine = 0.76 and 2. Ca++ = 7.8 which corrects to 8.36 (Low) given albumin = 3.3.   eICU Interventions  Will replace K+, PO4--- and Ca++.     Intervention Category Major Interventions: Electrolyte abnormality - evaluation and management  [name] 10/21/2019, 4:54 AM

## 2019-10-21 NOTE — Progress Notes (Signed)
EEG maint complete. No skin breakdown at FP1 FP2 F8

## 2019-10-21 NOTE — Progress Notes (Signed)
VAT: Intraosseous to left shoulder removed/discontinued. Dry dressing with vaseline gauze applied, hemostasis attained. No signs of bleeding from site noted, dressing clean dry and intact.

## 2019-10-21 NOTE — Progress Notes (Signed)
  Echocardiogram 2D Echocardiogram has been performed.  Delcie Roch 10/21/2019, 9:47 AM

## 2019-10-21 NOTE — Procedures (Addendum)
Patient Name: Bradley Rodgers  MRN: [id number]  Epilepsy Attending: Charlsie Quest  Referring Physician/Provider: Selmer Dominion, NP Duration: 11-15-2019 2206 to 10/21/2019 2206  Patient history: 73yo M s/p cardiac arrest on TTM. EEG to evaluate for seizure.  Level of alertness: comatose  AEDs during EEG study: versed  Technical aspects: This EEG study was done with scalp electrodes positioned according to the 10-20 International system of electrode placement. Electrical activity was acquired at a sampling rate of 500Hz  and reviewed with a high frequency filter of 70Hz  and a low frequency filter of 1Hz . EEG data were recorded continuously and digitally stored.   DESCRIPTION: EEG initially showed continuous generalized background attenuation lasting 3-5 seconds. Intermittent generalized 3-5hz  theta-delta slowing was also noted which at times appeared sharply contoured. Throughout the day gradually EEG showed longer bursts of slowing lasting 5-7 seconds and intermittent background attentuation lasting 2-4 seconds.. EEG was reactive to tactile stimulation. Hyperventilation and photic stimulation were not performed.  ABNORMALITY  - Background attenuation, generalized  - Intermittent slow, generalized   IMPRESSION: This study is suggestive of profound diffuse encephalopathy, non specific to etiology. No seizures or epileptiform discharges were seen throughout the recording.  EEG appears to be improving throughout the study.    Yeilyn Gent 

## 2019-10-22 LAB — CBC
HCT: 46.7 % (ref 39.0–52.0)
Hemoglobin: 16.1 g/dL (ref 13.0–17.0)
MCH: 30 pg (ref 26.0–34.0)
MCHC: 34.5 g/dL (ref 30.0–36.0)
MCV: 87.1 fL (ref 80.0–100.0)
Platelets: 262 10*3/uL (ref 150–400)
RBC: 5.36 MIL/uL (ref 4.22–5.81)
RDW: 13.7 % (ref 11.5–15.5)
WBC: 19.2 10*3/uL — ABNORMAL HIGH (ref 4.0–10.5)
nRBC: 0 % (ref 0.0–0.2)

## 2019-10-22 LAB — POCT I-STAT 7, (LYTES, BLD GAS, ICA,H+H)
Acid-base deficit: 7 mmol/L — ABNORMAL HIGH (ref 0.0–2.0)
Bicarbonate: 17.5 mmol/L — ABNORMAL LOW (ref 20.0–28.0)
Calcium, Ion: 1.15 mmol/L (ref 1.15–1.40)
HCT: 45 % (ref 39.0–52.0)
Hemoglobin: 15.3 g/dL (ref 13.0–17.0)
O2 Saturation: 95 %
Patient temperature: 37.2
Potassium: 4.1 mmol/L (ref 3.5–5.1)
Sodium: 139 mmol/L (ref 135–145)
TCO2: 18 mmol/L — ABNORMAL LOW (ref 22–32)
pCO2 arterial: 32.1 mmHg (ref 32.0–48.0)
pH, Arterial: 7.346 — ABNORMAL LOW (ref 7.350–7.450)
pO2, Arterial: 77 mmHg — ABNORMAL LOW (ref 83.0–108.0)

## 2019-10-22 LAB — GLUCOSE, CAPILLARY
Glucose-Capillary: 105 mg/dL — ABNORMAL HIGH (ref 70–99)
Glucose-Capillary: 110 mg/dL — ABNORMAL HIGH (ref 70–99)
Glucose-Capillary: 116 mg/dL — ABNORMAL HIGH (ref 70–99)
Glucose-Capillary: 121 mg/dL — ABNORMAL HIGH (ref 70–99)
Glucose-Capillary: 133 mg/dL — ABNORMAL HIGH (ref 70–99)
Glucose-Capillary: 134 mg/dL — ABNORMAL HIGH (ref 70–99)
Glucose-Capillary: 138 mg/dL — ABNORMAL HIGH (ref 70–99)

## 2019-10-22 LAB — BASIC METABOLIC PANEL
Anion gap: 12 (ref 5–15)
Anion gap: 10 (ref 5–15)
BUN: 15 mg/dL (ref 8–23)
BUN: 15 mg/dL (ref 8–23)
CO2: 16 mmol/L — ABNORMAL LOW (ref 22–32)
CO2: 16 mmol/L — ABNORMAL LOW (ref 22–32)
Calcium: 7.9 mg/dL — ABNORMAL LOW (ref 8.9–10.3)
Calcium: 7.7 mg/dL — ABNORMAL LOW (ref 8.9–10.3)
Chloride: 108 mmol/L (ref 98–111)
Chloride: 110 mmol/L (ref 98–111)
Creatinine, Ser: 0.98 mg/dL (ref 0.61–1.24)
Creatinine, Ser: 1 mg/dL (ref 0.61–1.24)
GFR calc Af Amer: >60 mL/min (ref >60)
GFR calc Af Amer: >60 mL/min (ref >60)
GFR calc non Af Amer: >60 mL/min (ref >60)
GFR calc non Af Amer: >60 mL/min (ref >60)
Glucose, Bld: 148 mg/dL — ABNORMAL HIGH (ref 70–99)
Glucose, Bld: 151 mg/dL — ABNORMAL HIGH (ref 70–99)
Potassium: 4.3 mmol/L (ref 3.5–5.1)
Potassium: 4.1 mmol/L (ref 3.5–5.1)
Sodium: 136 mmol/L (ref 135–145)
Sodium: 136 mmol/L (ref 135–145)

## 2019-10-22 LAB — PHOSPHORUS
Phosphorus: 3.7 mg/dL (ref 2.5–4.6)
Phosphorus: 2.6 mg/dL (ref 2.5–4.6)

## 2019-10-22 LAB — MAGNESIUM
Magnesium: 1.7 mg/dL (ref 1.7–2.4)
Magnesium: 1.9 mg/dL (ref 1.7–2.4)

## 2019-10-22 MED ORDER — VITAL HIGH PROTEIN PO LIQD: 1000.0000 mL | ORAL | Status: DC

## 2019-10-22 MED ORDER — SODIUM CHLORIDE 0.9 % IV BOLUS
500.0000 mL | Freq: Once | INTRAVENOUS | Status: AC
  Administered 2019-10-22: 500 mL via INTRAVENOUS

## 2019-10-22 MED ORDER — FENTANYL CITRATE (PF) 100 MCG/2ML IJ SOLN
50.0000 ug | Freq: Once | INTRAMUSCULAR | Status: AC
  Administered 2019-10-22: 50 ug via INTRAVENOUS

## 2019-10-22 MED ORDER — VITAL AF 1.2 CAL PO LIQD
1000.0000 mL | ORAL | Status: DC
  Administered 2019-10-22: 1000 mL

## 2019-10-22 MED ORDER — CARVEDILOL 6.25 MG PO TABS
6.2500 mg | ORAL_TABLET | Freq: Two times a day (BID) | ORAL | Status: DC
  Administered 2019-10-22 – 2019-10-23 (×3): 6.25 mg via ORAL
  Filled 2019-10-22 (×3): qty 1

## 2019-10-22 MED ORDER — PRO-STAT SUGAR FREE PO LIQD
30.0000 mL | Freq: Three times a day (TID) | ORAL | Status: DC
  Administered 2019-10-22 – 2019-10-24 (×7): 30 mL
  Filled 2019-10-22 (×7): qty 30

## 2019-10-22 MED ORDER — LACTATED RINGERS IV SOLN: INTRAVENOUS | Status: DC

## 2019-10-22 MED ORDER — PRO-STAT SUGAR FREE PO LIQD: 30.0000 mL | Freq: Two times a day (BID) | ORAL | Status: DC

## 2019-10-22 NOTE — Progress Notes (Signed)
Progress Note  Patient Name: Bradley Rodgers Date of Encounter: 10/22/2019  Primary Cardiologist: No primary care provider on file.  Dr. Burt Knack   Patient Profile     Bradley Rodgers is a 74 y.o. male with unknown past medical history, presenting with out of hospital cardiac arrest.  He suddenly collapsed and became unconscious while dropping off a friend.  Bystander CPR was immediately initiated and EMS called.  Upon EMS arrival patient received total of 3 rounds of epinephrine and 1 defibrillation restoring sinus rhythm after brief period of PEA turn into V. tach.  Emergent airway was exchanged in the ER with an ET tube.  Was hemodynamically stable.  EKG demonstrated significant anterior lateral ST elevations and the patient was brought emergently to the cardiac catheterization lab.  Principal Problem:   Cardiac arrest [facility]) Active Problems:   Acute respiratory failure with hypoxia and hypercapnia (HCC)   Cardiogenic shock (HCC)   STEMI involving left anterior descending coronary artery ([place])   Endotracheally intubated \  Subjective   Completing warming protocol.  Sedation held with minimal responsiveness.  Inpatient Medications    Scheduled Meds: . artificial tears  1 application Both Eyes B1Y  . aspirin  81 mg Per Tube Daily  . atorvastatin  80 mg Per Tube Daily  . carvedilol  6.25 mg Oral BID  . chlorhexidine gluconate (MEDLINE KIT)  15 mL Mouth Rinse BID  . Chlorhexidine Gluconate Cloth  6 each Topical Daily  . feeding supplement (PRO-STAT SUGAR FREE 64)  30 mL Per Tube TID  . fentaNYL (SUBLIMAZE) injection  50 mcg Intravenous Once  . heparin  5,000 Units Subcutaneous Q8H  . insulin aspart  0-9 Units Subcutaneous Q4H  . mouth rinse  15 mL Mouth Rinse 10 times per day  . metoCLOPramide (REGLAN) injection  5 mg Intravenous Q6H  . midazolam  1 mg Intravenous Once  . pantoprazole sodium  40 mg Per Tube Daily  . sodium chloride flush  10-40 mL Intracatheter Q12H  .  sodium chloride flush  3 mL Intravenous Q12H  . ticagrelor  90 mg Per Tube BID   Continuous Infusions: . sodium chloride 10 mL/hr at 10/27/2019 1718  . sodium chloride    . feeding supplement (VITAL AF 1.2 CAL) 1,000 mL (10/22/19 1507)  . fentaNYL infusion INTRAVENOUS Stopped (10/22/19 0858)  . lactated ringers 100 mL/hr at 10/22/19 1800  . midazolam Stopped (10/22/19 0858)  . norepinephrine (LEVOPHED) Adult infusion Stopped (10/22/19 1003)   PRN Meds: sodium chloride, acetaminophen, etomidate, fentaNYL, midazolam, nitroGLYCERIN, sodium chloride flush, sodium chloride flush   Vital Signs    Vitals:   10/22/19 1830 10/22/19 1900 10/22/19 1928 10/22/19 1933  BP: 137/76 109/69    Pulse: 95 95    Resp:      Temp:   99 F (37.2 C)   TempSrc:   Oral   SpO2: 99% 98%  98%  Weight:      Height:        Intake/Output Summary (Last 24 hours) at 10/22/2019 2244 Last data filed at 10/22/2019 2000 Gross per 24 hour  Intake 1116.3 ml  Output 470 ml  Net 646.3 ml   Last 3 Weights 10/14/2019 11/01/2019  Weight (lbs) 200 lb 198 lb  Weight (kg) 90.719 kg 89.812 kg      Telemetry    Sinus rhythm/bradycardia with frequent ectopy- Personally Reviewed  ECG   NSR-75 bpm.  Low voltage QRS Evolving changes of acute anterior MI T wave  abnormality, consider lateral ischemia- Personally Reviewed  Physical Exam   GEN:  Intubated, sedated,-> not responsive HEENT: Unequal pupils.  Not opening eyes to commands. Neck: Unable to assess any JVD.  IJ line in place. Cardiac:  RRR, no M/R/G.  Nonlabored.  Normal S1-S2.Marland Kitchen  Respiratory:  Still has coarse breath sounds throughout GI: Normoactive bowel sounds. MS:  No clubbing, cyanosis or edema.   Labs    High Sensitivity Troponin:   Recent Labs  Lab 11/08/2019 1645 10/11/2019 2010  TROPONINIHS 9,239* >27,000*      Chemistry Recent Labs  Lab 11/02/2019 1645 10/21/2019 1727 10/21/19 1950 10/21/19 1950 10/22/19 0131 10/22/19 0422 10/22/19 0431    NA 140   < > 138   < > 136 136 139  K 3.7   < > 4.1   < > 4.3 4.1 4.1  CL 107   < > 110  --  108 110  --   CO2 14*   < > 16*  --  16* 16*  --   GLUCOSE 273*   < > 132*  --  148* 151*  --   BUN 10   < > 12  --  15 15  --   CREATININE 1.33*   < > 0.89  --  0.98 1.00  --   CALCIUM 8.1*   < > 8.1*  --  7.9* 7.7*  --   PROT 6.2*  --   --   --   --   --   --   ALBUMIN 3.3*  --   --   --   --   --   --   AST 174*  --   --   --   --   --   --   ALT 118*  --   --   --   --   --   --   ALKPHOS 60  --   --   --   --   --   --   BILITOT 1.1  --   --   --   --   --   --   GFRNONAA 53*   < > >60  --  >60 >60  --   GFRAA >60   < > >60  --  >60 >60  --   ANIONGAP 19*   < > 12  --  12 10  --    < > = values in this interval not displayed.     Hematology Recent Labs  Lab 10/21/2019 1645 10/18/2019 1727 10/14/2019 2010 11/06/2019 2100 10/21/19 0303 10/22/19 0422 10/22/19 0431  WBC 18.2*  --  21.1*  --   --  19.2*  --   RBC 5.48  --  5.89*  --   --  5.36  --   HGB 16.7   < > 17.9*   < > 16.3 16.1 15.3  HCT 50.7   < > 52.0   < > 48.0 46.7 45.0  MCV 92.5  --  88.3  --   --  87.1  --   MCH 30.5  --  30.4  --   --  30.0  --   MCHC 32.9  --  34.4  --   --  34.5  --   RDW 13.1  --  13.2  --   --  13.7  --   PLT 253  --  291  --   --  262  --    < > =  values in this interval not displayed.    BNPNo results for input(s): BNP, PROBNP in the last 168 hours.   DDimer No results for input(s): DDIMER in the last 168 hours.   Radiology    EEG adult  Result Date: 10/21/2019 Lora Havens, MD     10/21/2019  8:59 AM Patient Name: Bradley Rodgers MRN: [id number] Epilepsy Attending: Lora Havens Referring Physician/Provider: Jennelle Human, NP Date: 11/02/2019 Duration: 21.04 mins Patient history: 74yo M s/p cardiac arrest on TTM. EEG to evaluate for seizure. Level of alertness: comatose AEDs during EEG study: versed Technical aspects: This EEG study was done with scalp electrodes positioned according to  the 10-20 International system of electrode placement. Electrical activity was acquired at a sampling rate of 500Hz  and reviewed with a high frequency filter of 70Hz  and a low frequency filter of 1Hz . EEG data were recorded continuously and digitally stored. DESCRIPTION: EEG showed continuous generalized background attenuation as well as intermittent generalized 3-5hz  theta-delta slowing. EEG was reactive to tactile stimulation. Hyperventilation and photic stimulation were not performed. ABNORMALITY - Background attenuation, generalized - Intermittent slow, generalized IMPRESSION: This study is suggestive of profound diffuse encephalopathy, non specific to etiology. No seizures or epileptiform discharges were seen throughout the recording. Priyanka Barbra Sarks   Overnight EEG with video  Result Date: 10/21/2019 Lora Havens, MD     10/22/2019  9:12 AM Patient Name: Paxten Appelt MRN: [id number] Epilepsy Attending: Lora Havens Referring Physician/Provider: Jennelle Human, NP Duration: 11/02/2019 2206 to 10/21/2019 2206  Patient history: 74yo M s/p cardiac arrest on TTM. EEG to evaluate for seizure.  Level of alertness: comatose  AEDs during EEG study: versed  Technical aspects: This EEG study was done with scalp electrodes positioned according to the 10-20 International system of electrode placement. Electrical activity was acquired at a sampling rate of 500Hz  and reviewed with a high frequency filter of 70Hz  and a low frequency filter of 1Hz . EEG data were recorded continuously and digitally stored.  DESCRIPTION: EEG initially showed continuous generalized background attenuation lasting 3-5 seconds. Intermittent generalized 3-5hz  theta-delta slowing was also noted which at times appeared sharply contoured. Throughout the day gradually EEG showed longer bursts of slowing lasting 5-7 seconds and intermittent background attentuation lasting 2-4 seconds.. EEG was reactive to tactile stimulation.  Hyperventilation and photic stimulation were not performed.  ABNORMALITY - Background attenuation, generalized - Intermittent slow, generalized  IMPRESSION: This study is suggestive of profound diffuse encephalopathy, non specific to etiology. No seizures or epileptiform discharges were seen throughout the recording. EEG appears to be improving throughout the study. Lora Havens   ECHOCARDIOGRAM COMPLETE  Result Date: 10/21/2019    ECHOCARDIOGRAM REPORT   Patient Name:   ZERRICK HANSSEN Date of Exam: 10/21/2019 Medical Rec #:  [id number]        Height:       73.0 in Accession #:    [id number]       Weight:       200.0 lb Date of Birth:  Dec 05, 1945        BSA:          2.152 m Patient Age:    30 years         BP:           99/70 mmHg Patient Gender: M                HR:           59 bpm. Exam  Location:  Inpatient Procedure: 2D Echo Indications:    acute systolic chf 149.70  History:        Patient has no prior history of Echocardiogram examinations.                 Cardiac arrest; CAD.  Sonographer:    Johny Chess Referring Phys: [phone] MICHAEL COOPER  Sonographer Comments: Echo performed with patient supine and on artificial respirator. IMPRESSIONS  1. Left ventricular ejection fraction, by estimation, is 35 to 40%. The left ventricle has moderately decreased function. The left ventricle demonstrates regional wall motion abnormalities (see scoring diagram/findings for description). Left ventricular  diastolic parameters are consistent with Grade I diastolic dysfunction (impaired relaxation).  2. Right ventricular systolic function is mildly reduced. The right ventricular size is normal. There is normal pulmonary artery systolic pressure.  3. The mitral valve is normal in structure. Trivial mitral valve regurgitation. No evidence of mitral stenosis.  4. The aortic valve is tricuspid. Aortic valve regurgitation is not visualized. No aortic stenosis is present. Comparison(s): No prior Echocardiogram.  Conclusion(s)/Recommendation(s): LV dysfunction with regional wall motion abnormalities as noted. FINDINGS  Left Ventricle: Left ventricular ejection fraction, by estimation, is 35 to 40%. The left ventricle has moderately decreased function. The left ventricle demonstrates regional wall motion abnormalities. The left ventricular internal cavity size was normal in size. There is no left ventricular hypertrophy. Left ventricular diastolic parameters are consistent with Grade I diastolic dysfunction (impaired relaxation).  LV Wall Scoring: The mid and distal anterior wall, mid and distal anterior septum, apical inferior segment, and apex are akinetic. The basal anteroseptal segment, apical lateral segment, mid inferoseptal segment, basal anterior segment, and mid inferior segment are hypokinetic. The antero-lateral wall, posterior wall, basal inferior segment, and basal inferoseptal segment are normal. Right Ventricle: The right ventricular size is normal. No increase in right ventricular wall thickness. Right ventricular systolic function is mildly reduced. There is normal pulmonary artery systolic pressure. The tricuspid regurgitant velocity is 2.04 m/s, and with an assumed right atrial pressure of 8 mmHg, the estimated right ventricular systolic pressure is 85.8 mmHg. Left Atrium: Left atrial size was normal in size. Right Atrium: Right atrial size was normal in size. Pericardium: A small pericardial effusion is present. Mitral Valve: The mitral valve is normal in structure. There is mild thickening of the mitral valve leaflet(s). Trivial mitral valve regurgitation. No evidence of mitral valve stenosis. Tricuspid Valve: The tricuspid valve is normal in structure. Tricuspid valve regurgitation is mild . No evidence of tricuspid stenosis. Aortic Valve: The aortic valve is tricuspid. Aortic valve regurgitation is not visualized. No aortic stenosis is present. Mild aortic valve annular calcification. Pulmonic Valve:  The pulmonic valve was not well visualized. Pulmonic valve regurgitation is not visualized. No evidence of pulmonic stenosis. Aorta: The aortic root is normal in size and structure. Venous: IVC assessment for right atrial pressure unable to be performed due to mechanical ventilation. IAS/Shunts: No atrial level shunt detected by color flow Doppler.  LEFT VENTRICLE PLAX 2D LVIDd:         4.90 cm      Diastology LVIDs:         3.90 cm      LV e' lateral:   4.90 cm/s LV PW:         1.10 cm      LV E/e' lateral: 8.7 LV IVS:        0.90 cm      LV e' medial:  3.70 cm/s LVOT diam:     1.80 cm      LV E/e' medial:  11.5 LV SV:         48 LV SV Index:   22 LVOT Area:     2.54 cm  LV Volumes (MOD) LV vol d, MOD A2C: 96.0 ml LV vol d, MOD A4C: 100.0 ml LV vol s, MOD A2C: 63.9 ml LV vol s, MOD A4C: 55.4 ml LV SV MOD A2C:     32.1 ml LV SV MOD A4C:     100.0 ml LV SV MOD BP:      37.9 ml RIGHT VENTRICLE RV S prime:     12.30 cm/s TAPSE (M-mode): 1.4 cm LEFT ATRIUM             Index       RIGHT ATRIUM           Index LA diam:        3.30 cm 1.53 cm/m  RA Area:     14.60 cm LA Vol (A2C):   40.4 ml 18.78 ml/m RA Volume:   36.10 ml  16.78 ml/m LA Vol (A4C):   50.0 ml 23.24 ml/m LA Biplane Vol: 45.5 ml 21.15 ml/m  AORTIC VALVE LVOT Vmax:   90.10 cm/s LVOT Vmean:  57.300 cm/s LVOT VTI:    0.187 m  AORTA Ao Root diam: 2.80 cm MITRAL VALVE               TRICUSPID VALVE MV Area (PHT): 2.37 cm    TR Peak grad:   16.6 mmHg MV Decel Time: 320 msec    TR Vmax:        204.00 cm/s MV E velocity: 42.40 cm/s MV A velocity: 52.30 cm/s  SHUNTS MV E/A ratio:  0.81        Systemic VTI:  0.19 m                            Systemic Diam: 1.80 cm Buford Dresser MD Electronically signed by Buford Dresser MD Signature Date/Time: 10/21/2019/4:10:04 PM    Final     Cardiac Studies   10/11/2019  CAD- PCI: Culprit - 100%prox LAD --> Resolute Onyx DES 3 x 22.  Severe ostial-proximal RI 80% disease extending back to LM.  LM 40%.   Patent small LCx.  Patent RCA-proximal 40%, RPA V 50%, RPDA 75%.   Initiated hypothermia protocol.  Guarded prognosis.  2D echo: EF 35-40%.  Moderately decreased.  GR 1 DD.  Mid and distal anterior wall, mid and distal anterior septum, apical inferior and apex akinetic.  Basal anteroseptal segment mid apical lateral mid inferoseptal, basal anterior segment and mid inferior segments-hypokinetic.  Anterolateral wall posterior wall basal inferior segment and basal inferoseptal wall normal.  Mildly reduced RV function.  Normal PA pressures.  Relatively normal valves.   Assessment & Plan    Principal Problem:   Cardiac arrest [facility]) Active Problems:   Acute respiratory failure with hypoxia and hypercapnia (HCC)   Cardiogenic shock (HCC)   STEMI involving left anterior descending coronary artery (HCC)   Endotracheally intubated  Status post LAD PCI with Dr. Burt Knack 10/30/2019  Excellent results.  Significantly reduced EF on echo not unexpected with anterior ST elevation MI-LAD involvement.  Blood pressures are stabilizing now no longer on cooling protocol.  Is warmed up.  Now in the process of awakening.  No longer on pressors.  Can start  carvedilol probably 6.25 mg twice daily given tachycardia and pressure.  If he tolerates carvedilol, next choice would be to probably consider starting low-dose ARB with plans to transition to [facility]. Exam monitor for signs of fluid overload and have low threshold for adding Lasix.  Also spironolactone.  Continue aspirin and Brilinta along with high-dose statin  For now watchful waiting during the end of cooling and rewarming.  I did spend 10 minutes talking to the patient's daughter answering questions and explaining the situation.  Clearly guarded condition with out of hospital arrest and large anterior MI.  Pending restoration of stable blood vital signs after rewarming, would gradually initiate guideline directed medical management.  Cardiology  standing by.   For questions or updates, please contact [place] Please consult [email or web address] for contact info under        Signed, Glenetta Hew, MD  10/22/2019, 10:44 PM

## 2019-10-22 NOTE — Progress Notes (Signed)
Nutrition Follow-up  DOCUMENTATION CODES:   Not applicable  INTERVENTION:   Tube feeding:  -Vital AF 1.2 @ 20 ml/hr via OGT -Increase by 10 ml Q4 hours to goal rate of 55 ml/hr (1320 ml) -30 ml Prostat TID  At goal TF provides: 1884 kcals, 144 grams protein, 1071 ml free water.   Monitor magnesium, potassium, and phosphorus daily for at least 3 days, MD to replete as needed, as pt is at risk for refeeding syndrome.  NUTRITION DIAGNOSIS:   Increased nutrient needs related to acute illness as evidenced by estimated needs.  Ongoing  GOAL:   Patient will meet greater than or equal to 90% of their needs  Addressed via TF  MONITOR:   Diet advancement, Vent status, Skin, TF tolerance, Weight trends, Labs, I & O's  REASON FOR ASSESSMENT:   Ventilator    ASSESSMENT:   Patient with unknown PMH. Presents this admission after cardiac arrest. Found to have anterior STEMI..   Pt discussed during ICU rounds and with RN.   Rewarming. Off pressors. Okay to feed per CCM.  Spoke with sister at bedside. Lives near pt but reports pt is very private. Denies any signs of weight loss. Unsure of UBW or daily meal composition.   Patient remains intubated on ventilator support MV: 9.2 L/min Temp (24hrs), Avg:97.7 F (36.5 C), Min:94.6 F (34.8 C), Max:99.7 F (37.6 C)   Admission weight: 89.8 kg  Current weight: 90.7 kg   I/O: +352 ml since admit  UOP: 340 ml x 24 hrs   Medications: SS novolog, 5 mg reglan QID Labs: CBG 121-189  NUTRITION - FOCUSED PHYSICAL EXAM:    Most Recent Value  Orbital Region  No depletion  Upper Arm Region  Mild depletion  Thoracic and Lumbar Region  Unable to assess  Buccal Region  Unable to assess  Temple Region  Mild depletion  Clavicle Bone Region  No depletion  Clavicle and Acromion Bone Region  No depletion  Scapular Bone Region  Unable to assess  Dorsal Hand  No depletion  Patellar Region  No depletion  Anterior Thigh Region  No  depletion  Posterior Calf Region  No depletion  Edema (RD Assessment)  Mild  Hair  Reviewed  Eyes  Unable to assess  Mouth  Unable to assess  Skin  Reviewed  Nails  Reviewed     Diet Order:   Diet Order            Diet NPO time specified  Diet effective now              EDUCATION NEEDS:   Not appropriate for education at this time  Skin:  Skin Assessment: Reviewed RN Assessment  Last BM:  PTA  Height:   Ht Readings from Last 1 Encounters:  10/24/2019 6\' 1"  (1.854 m)    Weight:   Wt Readings from Last 1 Encounters:  10/12/2019 90.7 kg    BMI:  Body mass index is 26.39 kg/m.  Estimated Nutritional Needs:   Kcal:  1814 kcal  Protein:  130-145 grams  Fluid:  >/= 1.8 L/day   12/20/19 RD, LDN Clinical Nutrition Pager listed in AMION

## 2019-10-22 NOTE — Procedures (Addendum)
Patient Name:Demonie Ignasiak BOE:[id number] Epilepsy Attending:Tevin Shillingford Annabelle Harman Referring Physician/Provider:Paula Lodema Hong, NP Duration: 10/21/2019 2206 to 10/22/2019 2206  Patient history:73yo M s/p cardiac arrest on TTM. EEG to evaluate for seizure.  Level of alertness:comatose  AEDs during EEG study:versed  Technical aspects: This EEG study was done with scalp electrodes positioned according to the 10-20 International system of electrode placement. Electrical activity was acquired at a sampling rate of 500Hz  and reviewed with a high frequency filter of 70Hz  and a low frequency filter of 1Hz . EEG data were recorded continuously and digitally stored.  DESCRIPTION:EEG showed continuous generalized 3-6Hz  theta-delta slowing lasting 5-7 seconds. At times, generalized periodic epileptiform discharges were seen with triphasic morphology at 1hz  without any evolution. EEG was reactive to tactile stimulation.Hyperventilation and photic stimulation were not performed.  ABNORMALITY  - Continuous slow, generalized - Periodic epileptiform discharges with triphasic morphology, generalized   IMPRESSION: This study issuggestive of generalized epileptogenicity as well as severe diffuse encephalopathy, non specific to etiology but could be secondary to anoxic/hypoxic injury. No seizures or epileptiform discharges were seen throughout the recording.    Shanekia Latella 

## 2019-10-22 NOTE — Progress Notes (Signed)
Leads maint'd PRN - no skin breakdown.

## 2019-10-22 NOTE — Progress Notes (Signed)
NAME:  Bradley Rodgers, MRN:  [id number], DOB:  11-May-1946, LOS: 2 ADMISSION DATE:  2019/10/23, CONSULTATION DATE:  10-23-2019 REFERRING MD:  Dr. Burt Knack, CHIEF COMPLAINT:  Cardiac Arrest  Brief History   74 year old male with unknown history presenting with out of hospital cardiac arrest with bystander CPR, ROSC after 3 rounds ACLS, found to have anterolateral STEMI s/p PCI and DES to LAD.  To ICU for TTM.   Past Medical History  Unknown  Significant Hospital Events   5/12 Admit post arrest  Consults:  PCCM  Procedures:  ETT 5/12 >> L IJ CVL 5/12 >> L Radial [place] 5/12 >>  Significant Diagnostic Tests:  5/12 LHC >>   LV end diastolic pressure is moderately elevated.  The left ventricular ejection fraction is 35-45% by visual estimate.  Prox RCA lesion is 40% stenosed.  RPAV lesion is 50% stenosed.  RPDA lesion is 75% stenosed.  Mid LM to Dist LM lesion is 40% stenosed.  Ramus lesion is 80% stenosed.  Prox LAD to Mid LAD lesion is 100% stenosed.  A drug-eluting stent was successfully placed using a STENT RESOLUTE ONYX 3.0X22.  Post intervention, there is a 0% residual stenosis.   1.  Acute total occlusion of the proximal LAD, treated successfully with a 3.0 x 22 mm resolute Onyx DES 2.  Severe stenosis of the ramus intermedius branch extending back to the left main 3.  Patent, small left circumflex 4.  Patent RCA with moderately severe bifurcation disease involving the posterior AV segment and PDA branches 5.  Moderate segmental LV systolic dysfunction with periapical akinesis and LVEF estimated at 40 to 45%, moderately elevated LVEDP consistent with acute systolic heart failure 6.  Successful placement of left internal jugular triple-lumen catheter under fluoroscopic guidance  5/12 CTH >> No acute intracranial abnormality noted.  5/13 EEG >> no seizures  Micro Data:  COVID 5/12 >> negative MRSA PCR 5/12 >> negative  Antimicrobials:    Interim  history/subjective:  No events, weaning sedation, hemodynamics improving.  Objective   Blood pressure (!) 148/88, pulse 96, temperature 98.4 F (36.9 C), temperature source Bladder, resp. rate (!) 8, height 6\' 1"  (1.854 m), weight 90.7 kg, SpO2 97 %. CVP:  [0 mmHg-47 mmHg] 47 mmHg  Vent Mode: PSV;CPAP FiO2 (%):  [40 %-50 %] 40 % Set Rate:  [24 bmp] 24 bmp Vt Set:  [450 mL] 450 mL PEEP:  [5 cmH20] 5 cmH20 Pressure Support:  [8 cmH20] 8 cmH20 Plateau Pressure:  [15 cmH20-22 cmH20] 22 cmH20   Intake/Output Summary (Last 24 hours) at 10/22/2019 1339 Last data filed at 10/22/2019 1200 Gross per 24 hour  Intake 960.73 ml  Output 400 ml  Net 560.73 ml   Filed Weights   Oct 23, 2019 1648 10-23-19 1714  Weight: 89.8 kg 90.7 kg    Examination: GEN: elderly man in bed in NAD HEENT: ETT in place, minimal secretions CV: RRR, +SEM, ext warm PULM: Clear, no wheezing GI: Soft, hypoactive BS EXT: No edema NEURO: He is moving his ext and opening eyes but not to command, seems delirious PSYCH: RASS +1 SKIN: multiple moles  Sugars okay Blood gas looks good, a little metabolic acidosis WBC trending down H/H stable Plts okay  Resolved Hospital Problem list     Assessment & Plan:   Cardiac Arrest Acute Anterolateral STEMI s/p PCI with DES to LAD Bradycardia with Pauses (5/13 am) - Antiplatelets and statin per cardiology - Start BB, bradycardia related to hypothermia  Acute  Hypoxic Respiratory Failure  In the setting of an acute STEMI   - Wean sedation - Mental status currently precludes extubation  Oliguria- gentle LR, continue foley  Best practice:  Diet: start TF Pain/Anxiety/Delirium protocol (if indicated): wean VAP protocol (if indicated): yes DVT prophylaxis: heparin GI prophylaxis: PPI Glucose control: CBG q 4 Mobility: BR Code Status: Full  Family Communication: updated at bedside Disposition: ICU    The patient is critically ill with multiple organ systems  failure and requires high complexity decision making for assessment and support, frequent evaluation and titration of therapies, application of advanced monitoring technologies and extensive interpretation of multiple databases. Critical Care Time devoted to patient care services described in this note independent of APP/resident time (if applicable)  is 32 minutes.   Myrla Halsted MD [facility] 10/22/2019 3:02 PM Personal pager: [phone] If unanswered, please page CCM On-call: [phone]

## 2019-10-22 NOTE — Progress Notes (Signed)
eLink Physician-Brief Progress Note Patient Name: Bradley Rodgers DOB: December 23, 1945 MRN: [id number]   Date of Service  10/22/2019  HPI/Events of Note  Notified of oliguria CVP 1-2 on positive pressure ventilation  eICU Interventions  Ordered 500 cc NS bolus     Intervention Category Intermediate Interventions: Oliguria - evaluation and management  Darl Pikes 10/22/2019, 2:35 AM

## 2019-10-23 ENCOUNTER — Inpatient Hospital Stay (HOSPITAL_COMMUNITY): Payer: No Typology Code available for payment source

## 2019-10-23 LAB — POCT I-STAT 7, (LYTES, BLD GAS, ICA,H+H)
Acid-base deficit: 6 mmol/L — ABNORMAL HIGH (ref 0.0–2.0)
Acid-base deficit: 6 mmol/L — ABNORMAL HIGH (ref 0.0–2.0)
Bicarbonate: 19.3 mmol/L — ABNORMAL LOW (ref 20.0–28.0)
Bicarbonate: 16.7 mmol/L — ABNORMAL LOW (ref 20.0–28.0)
Calcium, Ion: 1.23 mmol/L (ref 1.15–1.40)
Calcium, Ion: 1.11 mmol/L — ABNORMAL LOW (ref 1.15–1.40)
HCT: 41 % (ref 39.0–52.0)
HCT: 40 % (ref 39.0–52.0)
Hemoglobin: 13.9 g/dL (ref 13.0–17.0)
Hemoglobin: 13.6 g/dL (ref 13.0–17.0)
O2 Saturation: 96 %
O2 Saturation: 94 %
Patient temperature: 37.4
Patient temperature: 37.8
Potassium: 4.4 mmol/L (ref 3.5–5.1)
Potassium: 3.4 mmol/L — ABNORMAL LOW (ref 3.5–5.1)
Sodium: 136 mmol/L (ref 135–145)
Sodium: 139 mmol/L (ref 135–145)
TCO2: 20 mmol/L — ABNORMAL LOW (ref 22–32)
TCO2: 17 mmol/L — ABNORMAL LOW (ref 22–32)
pCO2 arterial: 35.6 mmHg (ref 32.0–48.0)
pCO2 arterial: 26.5 mmHg — ABNORMAL LOW (ref 32.0–48.0)
pH, Arterial: 7.344 — ABNORMAL LOW (ref 7.350–7.450)
pH, Arterial: 7.411 (ref 7.350–7.450)
pO2, Arterial: 88 mmHg (ref 83.0–108.0)
pO2, Arterial: 72 mmHg — ABNORMAL LOW (ref 83.0–108.0)

## 2019-10-23 LAB — BASIC METABOLIC PANEL
Anion gap: 8 (ref 5–15)
BUN: 23 mg/dL (ref 8–23)
CO2: 20 mmol/L — ABNORMAL LOW (ref 22–32)
Calcium: 8.3 mg/dL — ABNORMAL LOW (ref 8.9–10.3)
Chloride: 107 mmol/L (ref 98–111)
Creatinine, Ser: 0.8 mg/dL (ref 0.61–1.24)
GFR calc Af Amer: >60 mL/min (ref >60)
GFR calc non Af Amer: >60 mL/min (ref >60)
Glucose, Bld: 124 mg/dL — ABNORMAL HIGH (ref 70–99)
Potassium: 4.5 mmol/L (ref 3.5–5.1)
Sodium: 135 mmol/L (ref 135–145)

## 2019-10-23 LAB — GLUCOSE, CAPILLARY
Glucose-Capillary: 122 mg/dL — ABNORMAL HIGH (ref 70–99)
Glucose-Capillary: 113 mg/dL — ABNORMAL HIGH (ref 70–99)
Glucose-Capillary: 130 mg/dL — ABNORMAL HIGH (ref 70–99)
Glucose-Capillary: 88 mg/dL (ref 70–99)
Glucose-Capillary: 109 mg/dL — ABNORMAL HIGH (ref 70–99)

## 2019-10-23 LAB — MAGNESIUM
Magnesium: 1.9 mg/dL (ref 1.7–2.4)
Magnesium: 1.5 mg/dL — ABNORMAL LOW (ref 1.7–2.4)

## 2019-10-23 LAB — PHOSPHORUS
Phosphorus: 2.1 mg/dL — ABNORMAL LOW (ref 2.5–4.6)
Phosphorus: 1.4 mg/dL — ABNORMAL LOW (ref 2.5–4.6)

## 2019-10-23 MED ORDER — NOREPINEPHRINE 16 MG/250ML-% IV SOLN
0.0000 ug/min | INTRAVENOUS | Status: DC
  Administered 2019-10-23: 46 ug/min via INTRAVENOUS
  Administered 2019-10-23 – 2019-10-24 (×2): 50 ug/min via INTRAVENOUS
  Filled 2019-10-23 (×4): qty 250

## 2019-10-23 MED ORDER — MAGNESIUM SULFATE 4 GM/100ML IV SOLN
4.0000 g | Freq: Once | INTRAVENOUS | Status: AC
  Administered 2019-10-23: 4 g via INTRAVENOUS
  Filled 2019-10-23: qty 100

## 2019-10-23 MED ORDER — FUROSEMIDE 10 MG/ML IJ SOLN
80.0000 mg | Freq: Once | INTRAMUSCULAR | Status: AC
  Administered 2019-10-23: 80 mg via INTRAVENOUS

## 2019-10-23 MED ORDER — NOREPINEPHRINE 4 MG/250ML-% IV SOLN: 0.0000 ug/min | INTRAVENOUS | Status: DC

## 2019-10-23 MED ORDER — LOSARTAN POTASSIUM 25 MG PO TABS
12.5000 mg | ORAL_TABLET | Freq: Every day | ORAL | Status: DC
  Administered 2019-10-23: 12.5 mg via ORAL
  Filled 2019-10-23: qty 1

## 2019-10-23 MED ORDER — DOPAMINE-DEXTROSE 3.2-5 MG/ML-% IV SOLN
INTRAVENOUS | Status: AC
  Filled 2019-10-23: qty 250

## 2019-10-23 MED ORDER — ACETAMINOPHEN 160 MG/5ML PO SOLN
650.0000 mg | ORAL | Status: DC | PRN
  Administered 2019-10-24 (×2): 650 mg
  Filled 2019-10-23 (×2): qty 20.3

## 2019-10-23 MED ORDER — HEPARIN SODIUM (PORCINE) 5000 UNIT/ML IJ SOLN
5000.0000 [IU] | Freq: Three times a day (TID) | INTRAMUSCULAR | Status: DC
  Administered 2019-10-23 – 2019-10-24 (×5): 5000 [IU] via SUBCUTANEOUS
  Filled 2019-10-23 (×5): qty 1

## 2019-10-23 MED ORDER — DOPAMINE-DEXTROSE 3.2-5 MG/ML-% IV SOLN
0.0000 ug/kg/min | INTRAVENOUS | Status: DC
  Administered 2019-10-23: 5 ug/kg/min via INTRAVENOUS
  Administered 2019-10-24: 10 ug/kg/min via INTRAVENOUS
  Filled 2019-10-23: qty 250

## 2019-10-23 MED ORDER — LABETALOL HCL 5 MG/ML IV SOLN: 10.0000 mg | INTRAVENOUS | Status: DC | PRN

## 2019-10-23 MED ORDER — FUROSEMIDE 10 MG/ML IJ SOLN
INTRAMUSCULAR | Status: AC
  Filled 2019-10-23: qty 4

## 2019-10-23 NOTE — Progress Notes (Signed)
eLink Physician-Brief Progress Note Patient Name: Bradley Rodgers DOB: [birth year] MRN: [id number]   Date of Service  10/23/2019  HPI/Events of Note  Hemoptysis - Heparin [place] already D/Ced.   eICU Interventions  Will order: 1. Portable CXR now.      Intervention Category Major Interventions: Other:  [name] 10/23/2019, 6:16 AM

## 2019-10-23 NOTE — Progress Notes (Signed)
eLink Physician-Brief Progress Note Patient Name: Bradley Rodgers DOB: April 09, 1946 MRN: [id number]   Date of Service  10/23/2019  HPI/Events of Note  Request to add BMP to AM labs.  eICU Interventions  Will add BMP to blood sample already in the lab.      Intervention Category Major Interventions: Other:  [name] 10/23/2019, 5:35 AM

## 2019-10-23 NOTE — Progress Notes (Signed)
NAME:  Bradley Rodgers, MRN:  [id number], DOB:  11-Feb-1946, LOS: 3 ADMISSION DATE:  10/27/2019, CONSULTATION DATE:  11/06/2019 REFERRING MD:  Dr. Excell Seltzer, CHIEF COMPLAINT:  Cardiac Arrest  Brief History   74 year old male with unknown history presenting with out of hospital cardiac arrest with bystander CPR, ROSC after 3 rounds ACLS, found to have anterolateral STEMI s/p PCI and DES to LAD.  To ICU for TTM.   Past Medical History  Unknown  Significant Hospital Events   5/12 Admit post arrest  Consults:  PCCM  Procedures:  ETT 5/12 >> L IJ CVL 5/12 >> L Radial Aline 5/12 >>  Significant Diagnostic Tests:  5/12 LHC >>   LV end diastolic pressure is moderately elevated.  The left ventricular ejection fraction is 35-45% by visual estimate.  Prox RCA lesion is 40% stenosed.  RPAV lesion is 50% stenosed.  RPDA lesion is 75% stenosed.  Mid LM to Dist LM lesion is 40% stenosed.  Ramus lesion is 80% stenosed.  Prox LAD to Mid LAD lesion is 100% stenosed.  A drug-eluting stent was successfully placed using a STENT RESOLUTE ONYX 3.0X22.  Post intervention, there is a 0% residual stenosis.   1.  Acute total occlusion of the proximal LAD, treated successfully with a 3.0 x 22 mm resolute Onyx DES 2.  Severe stenosis of the ramus intermedius branch extending back to the left main 3.  Patent, small left circumflex 4.  Patent RCA with moderately severe bifurcation disease involving the posterior AV segment and PDA branches 5.  Moderate segmental LV systolic dysfunction with periapical akinesis and LVEF estimated at 40 to 45%, moderately elevated LVEDP consistent with acute systolic heart failure 6.  Successful placement of left internal jugular triple-lumen catheter under fluoroscopic guidance  5/12 CTH >> No acute intracranial abnormality noted.  5/13 EEG >> no seizures  Micro Data:  COVID 5/12 >> negative MRSA PCR 5/12 >> negative  Antimicrobials:    Interim  history/subjective:  Hematuria overnight noticed.  Heparin stopped.  Hypertensive this morning.  Lasix given.  Objective   Blood pressure 113/82, pulse 88, temperature 98.2 F (36.8 C), temperature source Bladder, resp. rate 12, height 6\' 1"  (1.854 m), weight 97 kg, SpO2 91 %. CVP:  [9 mmHg-59 mmHg] 11 mmHg  Vent Mode: PRVC;SIMV;PSV FiO2 (%):  [40 %-60 %] 60 % Set Rate:  [24 bmp] 24 bmp Vt Set:  [450 mL] 450 mL PEEP:  [5 cmH20-10 cmH20] 10 cmH20 Pressure Support:  [8 cmH20-15 cmH20] 15 cmH20 Plateau Pressure:  [8 cmH20-20 cmH20] 11 cmH20   Intake/Output Summary (Last 24 hours) at 10/23/2019 0954 Last data filed at 10/23/2019 0700 Gross per 24 hour  Intake 1810.52 ml  Output 550 ml  Net 1260.52 ml   Filed Weights   10/10/2019 1648 10/17/2019 1714 10/23/19 0800  Weight: 89.8 kg 90.7 kg 97 kg    Examination: GEN: Elderly male intubated on mechanical life support HEENT: Endotracheal tube in place, attempted possibly to open eyes to stimulation and pain. CV: Regular rate rhythm, S1-S2, systolic murmur PULM: Bilateral mechanically ventilated breath sounds GI: Soft, nondistended, cooling pads in place across the abdomen EXT: No significant edema NEURO: Moves extremities spontaneously, movements, nothing purposeful PSYCH: RASS +1 SKIN: Multiple skin lesions  Labs reviewed Glucose stable serum creatinine 0.8 Arterial blood gas normal  Chest x-ray 10/23/2019: Bilateral pulmonary edema  Resolved Hospital Problem list     Assessment & Plan:   Cardiac Arrest Acute Anterolateral STEMI s/p  PCI with DES to LAD Bradycardia with Pauses (5/13 am) Cardiogenic shock, heart failure with reduced ejection fraction -Antiplatelets, statin per cardiology services -Beta-blocker, ARB per cardiology. -Agree with diuresis -Needs better BP control, orals given today, likely need to increase -As needed labetalol available. -Code cool rewarming protocol to remove pads this evening.  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 We appreciate neurology -Head CT negative -May consider MR imaging  Acute Hypoxic Respiratory Failure  Mental status precludes liberation from vent -Remains on full mechanical support -Bilateral pulmonary edema  Oliguria- -Continue to observe urine output  Best practice:  Diet: start TF Pain/Anxiety/Delirium protocol (if indicated): wean VAP protocol (if indicated): yes DVT prophylaxis: heparin GI prophylaxis: PPI Glucose control: CBG q 4 Mobility: BR Code Status: Full  Family Communication: updated at bedside Disposition: ICU   This patient is critically ill with multiple organ system failure; which, requires frequent high complexity decision making, assessment, support, evaluation, and titration of therapies. This was completed through the application of advanced monitoring technologies and extensive interpretation of multiple databases. During this encounter critical care time was devoted to patient care services described in this note for 33 minutes.  Garner Nash, DO [facility] 10/23/2019 9:54 AM

## 2019-10-23 NOTE — Progress Notes (Signed)
eLink Physician-Brief Progress Note Patient Name: Bradley Rodgers DOB: [birth year] MRN: [id number]   Date of Service  10/23/2019  HPI/Events of Note  Nursing concern for bloody urine x 3 days. Patient is on Heparin [place], ASA  and Brilinta.   eICU Interventions  Will order: 1. D/C Heparin [place]. 2. Place SCD's to bilateral LE's. 3. Follow hematuria off Heparin [place].      Intervention Category Major Interventions: Other:  [name] 10/23/2019, 3:23 AM

## 2019-10-23 NOTE — Progress Notes (Signed)
Patient seen together with Dr. Johney Frame, will d/c losartan, start on dopamine along with levophed.

## 2019-10-23 NOTE — Progress Notes (Signed)
Pt lung sounds revealed bilateral crackles throughout. Pt's demonstrated increased WOB and his oxygen and peep requirements had increased. Oliguria present with positive fluid balance. CVP 11. 7 kg weight increase since admission noted. Maintenance fluid infusing at 100 ml/hr was paused and  Dr. Diona Browner was made aware at bedside. Orders for 80 mg IV lasix given. Will continue to monitor.

## 2019-10-23 NOTE — Progress Notes (Signed)
LTM EEG discontinued -skin breakdown at [facility].   Fp1  Fp2  F7 nurse notied

## 2019-10-23 NOTE — Procedures (Signed)
Patient Name:Bradley Rodgers TSV:[id number] Epilepsy Attending:Nyzier Boivin Annabelle Harman Referring Physician/Provider:Paula Lodema Hong, NP Duration:10/22/2019 2206 to 5/15/20210856  Patient history:73yo M s/p cardiac arrest on TTM. EEG to evaluate for seizure.  Level of alertness:comatose  AEDs during EEG study:versed  Technical aspects: This EEG study was done with scalp electrodes positioned according to the 10-20 International system of electrode placement. Electrical activity was acquired at a sampling rate of 500Hz  and reviewed with a high frequency filter of 70Hz  and a low frequency filter of 1Hz . EEG data were recorded continuously and digitally stored.  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 EEG was reactive to tactile stimulation.Hyperventilation and photic stimulation were not performed.  ABNORMALITY  - Continuous slow, generalized - Periodic epileptiform discharges with triphasic morphology, generalized   IMPRESSION: This study issuggestive of generalized epileptogenicity as well as severe diffuse encephalopathy, non specific to etiology but could be secondary to anoxic/hypoxic injury. No seizures or epileptiform discharges were seen throughout the recording.  Phylis Javed 

## 2019-10-23 NOTE — Progress Notes (Signed)
Pt RR 35-40. Respiratory therapy called to bedside. ABG obtained. Pt quickly became hypotensive and bradycardic. Levophed was restarted. Dr. Tonia Brooms was asked to assess patient at bedside. No new orders given. On call Cardiology made aware of pt's increasing hemodynamic instability. Will continue to monitor.

## 2019-10-23 NOTE — Progress Notes (Signed)
Progress Note  Patient Name: Bradley Rodgers Date of Encounter: 10/23/2019  Primary Cardiologist: Sherren Mocha, MD  Subjective   Patient remains on ventilator, sedation has been turned off, per nursing patient has opened eyes occasionally but not purposefully or followed commands.  Inpatient Medications    Scheduled Meds: . artificial tears  1 application Both Eyes O5D  . aspirin  81 mg Per Tube Daily  . atorvastatin  80 mg Per Tube Daily  . carvedilol  6.25 mg Oral BID  . chlorhexidine gluconate (MEDLINE KIT)  15 mL Mouth Rinse BID  . Chlorhexidine Gluconate Cloth  6 each Topical Daily  . feeding supplement (PRO-STAT SUGAR FREE 64)  30 mL Per Tube TID  . fentaNYL (SUBLIMAZE) injection  50 mcg Intravenous Once  . insulin aspart  0-9 Units Subcutaneous Q4H  . mouth rinse  15 mL Mouth Rinse 10 times per day  . metoCLOPramide (REGLAN) injection  5 mg Intravenous Q6H  . midazolam  1 mg Intravenous Once  . pantoprazole sodium  40 mg Per Tube Daily  . sodium chloride flush  10-40 mL Intracatheter Q12H  . sodium chloride flush  3 mL Intravenous Q12H  . ticagrelor  90 mg Per Tube BID   Continuous Infusions: . sodium chloride 10 mL/hr at 11/07/2019 1718  . sodium chloride    . feeding supplement (VITAL AF 1.2 CAL) 40 mL/hr at 10/22/19 2300  . fentaNYL infusion INTRAVENOUS 100 mcg/hr (10/23/19 0700)  . lactated ringers 100 mL/hr at 10/23/19 0700  . midazolam Stopped (10/23/19 0017)  . norepinephrine (LEVOPHED) Adult infusion 2 mcg/min (10/23/19 0026)   PRN Meds: sodium chloride, acetaminophen, etomidate, fentaNYL, midazolam, nitroGLYCERIN, sodium chloride flush, sodium chloride flush   Vital Signs    Vitals:   10/23/19 0655 10/23/19 0700 10/23/19 0800 10/23/19 0907  BP:  113/82    Pulse: 96 88    Resp:      Temp: 98.2 F (36.8 C) 98.2 F (36.8 C)    TempSrc: Bladder Bladder    SpO2: 94% 94%  91%  Weight:   97 kg   Height:        Intake/Output Summary (Last 24  hours) at 10/23/2019 0923 Last data filed at 10/23/2019 0700 Gross per 24 hour  Intake 1810.52 ml  Output 550 ml  Net 1260.52 ml   Filed Weights   10/26/2019 1648 10/27/2019 1714 10/23/19 0800  Weight: 89.8 kg 90.7 kg 97 kg    Telemetry    Sinus rhythm.  Personally reviewed.  ECG    An ECG dated 10/22/2019 was personally reviewed today and demonstrated:  Sinus rhythm with evolutionary changes of recent anterior infarct.  Physical Exam   GEN:  Elderly male on ventilator.  No acute distress.   Neck:  Increased JVP. Cardiac: RRR, no gallop.  Respiratory:  Diffuse crackles and rhonchi. GI: Soft, nontender, bowel sounds present. MS: No edema; No deformity. Neuro:   No purposeful movements or following of commands.  Labs    Chemistry Recent Labs  Lab 11/03/2019 1645 10/23/2019 1727 10/22/19 0131 10/22/19 0131 10/22/19 0422 10/22/19 0422 10/22/19 0431 10/23/19 0346 10/23/19 0549  NA 140   < > 136   < > 136   < > 139 135 136  K 3.7   < > 4.3   < > 4.1   < > 4.1 4.5 4.4  CL 107   < > 108  --  110  --   --  107  --  CO2 14*   < > 16*  --  16*  --   --  20*  --   GLUCOSE 273*   < > 148*  --  151*  --   --  124*  --   BUN 10   < > 15  --  15  --   --  23  --   CREATININE 1.33*   < > 0.98  --  1.00  --   --  0.80  --   CALCIUM 8.1*   < > 7.9*  --  7.7*  --   --  8.3*  --   PROT 6.2*  --   --   --   --   --   --   --   --   ALBUMIN 3.3*  --   --   --   --   --   --   --   --   AST 174*  --   --   --   --   --   --   --   --   ALT 118*  --   --   --   --   --   --   --   --   ALKPHOS 60  --   --   --   --   --   --   --   --   BILITOT 1.1  --   --   --   --   --   --   --   --   GFRNONAA 53*   < > >60  --  >60  --   --  >60  --   GFRAA >60   < > >60  --  >60  --   --  >60  --   ANIONGAP 19*   < > 12  --  10  --   --  8  --    < > = values in this interval not displayed.     Hematology Recent Labs  Lab 10/18/2019 1645 10/11/2019 1727 11/05/2019 2010 10/19/2019 2100 10/22/19 0422  10/22/19 0431 10/23/19 0549  WBC 18.2*  --  21.1*  --  19.2*  --   --   RBC 5.48  --  5.89*  --  5.36  --   --   HGB 16.7   < > 17.9*   < > 16.1 15.3 13.9  HCT 50.7   < > 52.0   < > 46.7 45.0 41.0  MCV 92.5  --  88.3  --  87.1  --   --   MCH 30.5  --  30.4  --  30.0  --   --   MCHC 32.9  --  34.4  --  34.5  --   --   RDW 13.1  --  13.2  --  13.7  --   --   PLT 253  --  291  --  262  --   --    < > = values in this interval not displayed.    Cardiac Enzymes Recent Labs  Lab 10/23/2019 1645 10/17/2019 2010  TROPONINIHS 9,239* >27,000*    Radiology    DG CHEST PORT 1 VIEW  Result Date: 10/23/2019 CLINICAL DATA:  Hemoptysis, STEMI, post CPR. EXAM: PORTABLE CHEST 1 VIEW COMPARISON:  Chest x-ray dated 10/19/2019. FINDINGS: Tubes and lines appear stable in position. Central pulmonary vascular congestion. New streaky opacities at the  medial aspects of the LEFT lung base and within the RIGHT suprahilar lung. Remainder of the lungs remain clear. No pneumothorax is seen. No displaced rib fracture seen. IMPRESSION: 1. Central pulmonary vascular congestion indicating volume overload/CHF. 2. New streaky opacities at the medial aspects of the LEFT lung base and within the RIGHT suprahilar lung, could represent asymmetric edema, atelectasis or pneumonia. 3. Tubes and lines appear stable in position. Electronically Signed   By: Franki Cabot M.D.   On: 10/23/2019 07:31   Overnight EEG with video  Result Date: 10/21/2019 Lora Havens, MD     10/22/2019  9:12 AM Patient Name: Bradley Rodgers MRN: [id number] Epilepsy Attending: Lora Havens Referring Physician/Provider: Jennelle Human, NP Duration: 10/15/2019 2206 to 10/21/2019 2206  Patient history: 74yo M s/p cardiac arrest on TTM. EEG to evaluate for seizure.  Level of alertness: comatose  AEDs during EEG study: versed  Technical aspects: This EEG study was done with scalp electrodes positioned according to the 10-20 International system of  electrode placement. Electrical activity was acquired at a sampling rate of 500Hz  and reviewed with a high frequency filter of 70Hz  and a low frequency filter of 1Hz . EEG data were recorded continuously and digitally stored.  DESCRIPTION: EEG initially showed continuous generalized background attenuation lasting 3-5 seconds. Intermittent generalized 3-5hz  theta-delta slowing was also noted which at times appeared sharply contoured. Throughout the day gradually EEG showed longer bursts of slowing lasting 5-7 seconds and intermittent background attentuation lasting 2-4 seconds.. EEG was reactive to tactile stimulation. Hyperventilation and photic stimulation were not performed.  ABNORMALITY - Background attenuation, generalized - Intermittent slow, generalized  IMPRESSION: This study is suggestive of profound diffuse encephalopathy, non specific to etiology. No seizures or epileptiform discharges were seen throughout the recording. EEG appears to be improving throughout the study. Lora Havens   ECHOCARDIOGRAM COMPLETE  Result Date: 10/21/2019    ECHOCARDIOGRAM REPORT   Patient Name:   Bradley Rodgers Date of Exam: 10/21/2019 Medical Rec #:  [id number]        Height:       73.0 in Accession #:    [id number]       Weight:       200.0 lb Date of Birth:  08-Apr-1946        BSA:          2.152 m Patient Age:    34 years         BP:           99/70 mmHg Patient Gender: M                HR:           59 bpm. Exam Location:  Inpatient Procedure: 2D Echo Indications:    acute systolic chf 595.63  History:        Patient has no prior history of Echocardiogram examinations.                 Cardiac arrest; CAD.  Sonographer:    Johny Chess Referring Phys: [phone] MICHAEL COOPER  Sonographer Comments: Echo performed with patient supine and on artificial respirator. IMPRESSIONS  1. Left ventricular ejection fraction, by estimation, is 35 to 40%. The left ventricle has moderately decreased function. The left ventricle  demonstrates regional wall motion abnormalities (see scoring diagram/findings for description). Left ventricular  diastolic parameters are consistent with Grade I diastolic dysfunction (impaired relaxation).  2. Right ventricular systolic function is mildly reduced. The right ventricular  size is normal. There is normal pulmonary artery systolic pressure.  3. The mitral valve is normal in structure. Trivial mitral valve regurgitation. No evidence of mitral stenosis.  4. The aortic valve is tricuspid. Aortic valve regurgitation is not visualized. No aortic stenosis is present. Comparison(s): No prior Echocardiogram. Conclusion(s)/Recommendation(s): LV dysfunction with regional wall motion abnormalities as noted. FINDINGS  Left Ventricle: Left ventricular ejection fraction, by estimation, is 35 to 40%. The left ventricle has moderately decreased function. The left ventricle demonstrates regional wall motion abnormalities. The left ventricular internal cavity size was normal in size. There is no left ventricular hypertrophy. Left ventricular diastolic parameters are consistent with Grade I diastolic dysfunction (impaired relaxation).  LV Wall Scoring: The mid and distal anterior wall, mid and distal anterior septum, apical inferior segment, and apex are akinetic. The basal anteroseptal segment, apical lateral segment, mid inferoseptal segment, basal anterior segment, and mid inferior segment are hypokinetic. The antero-lateral wall, posterior wall, basal inferior segment, and basal inferoseptal segment are normal. Right Ventricle: The right ventricular size is normal. No increase in right ventricular wall thickness. Right ventricular systolic function is mildly reduced. There is normal pulmonary artery systolic pressure. The tricuspid regurgitant velocity is 2.04 m/s, and with an assumed right atrial pressure of 8 mmHg, the estimated right ventricular systolic pressure is 90.3 mmHg. Left Atrium: Left atrial size was  normal in size. Right Atrium: Right atrial size was normal in size. Pericardium: A small pericardial effusion is present. Mitral Valve: The mitral valve is normal in structure. There is mild thickening of the mitral valve leaflet(s). Trivial mitral valve regurgitation. No evidence of mitral valve stenosis. Tricuspid Valve: The tricuspid valve is normal in structure. Tricuspid valve regurgitation is mild . No evidence of tricuspid stenosis. Aortic Valve: The aortic valve is tricuspid. Aortic valve regurgitation is not visualized. No aortic stenosis is present. Mild aortic valve annular calcification. Pulmonic Valve: The pulmonic valve was not well visualized. Pulmonic valve regurgitation is not visualized. No evidence of pulmonic stenosis. Aorta: The aortic root is normal in size and structure. Venous: IVC assessment for right atrial pressure unable to be performed due to mechanical ventilation. IAS/Shunts: No atrial level shunt detected by color flow Doppler.  LEFT VENTRICLE PLAX 2D LVIDd:         4.90 cm      Diastology LVIDs:         3.90 cm      LV e' lateral:   4.90 cm/s LV PW:         1.10 cm      LV E/e' lateral: 8.7 LV IVS:        0.90 cm      LV e' medial:    3.70 cm/s LVOT diam:     1.80 cm      LV E/e' medial:  11.5 LV SV:         48 LV SV Index:   22 LVOT Area:     2.54 cm  LV Volumes (MOD) LV vol d, MOD A2C: 96.0 ml LV vol d, MOD A4C: 100.0 ml LV vol s, MOD A2C: 63.9 ml LV vol s, MOD A4C: 55.4 ml LV SV MOD A2C:     32.1 ml LV SV MOD A4C:     100.0 ml LV SV MOD BP:      37.9 ml RIGHT VENTRICLE RV S prime:     12.30 cm/s TAPSE (M-mode): 1.4 cm LEFT ATRIUM  Index       RIGHT ATRIUM           Index LA diam:        3.30 cm 1.53 cm/m  RA Area:     14.60 cm LA Vol (A2C):   40.4 ml 18.78 ml/m RA Volume:   36.10 ml  16.78 ml/m LA Vol (A4C):   50.0 ml 23.24 ml/m LA Biplane Vol: 45.5 ml 21.15 ml/m  AORTIC VALVE LVOT Vmax:   90.10 cm/s LVOT Vmean:  57.300 cm/s LVOT VTI:    0.187 m  AORTA Ao Root  diam: 2.80 cm MITRAL VALVE               TRICUSPID VALVE MV Area (PHT): 2.37 cm    TR Peak grad:   16.6 mmHg MV Decel Time: 320 msec    TR Vmax:        204.00 cm/s MV E velocity: 42.40 cm/s MV A velocity: 52.30 cm/s  SHUNTS MV E/A ratio:  0.81        Systemic VTI:  0.19 m                            Systemic Diam: 1.80 cm Buford Dresser MD Electronically signed by Buford Dresser MD Signature Date/Time: 10/21/2019/4:10:04 PM    Final     Cardiac Studies   Cardiac catheterization 6/80/3212:  LV end diastolic pressure is moderately elevated.  The left ventricular ejection fraction is 35-45% by visual estimate.  Prox RCA lesion is 40% stenosed.  RPAV lesion is 50% stenosed.  RPDA lesion is 75% stenosed.  Mid LM to Dist LM lesion is 40% stenosed.  Ramus lesion is 80% stenosed.  Prox LAD to Mid LAD lesion is 100% stenosed.  A drug-eluting stent was successfully placed using a STENT RESOLUTE ONYX 3.0X22.  Post intervention, there is a 0% residual stenosis.   1.  Acute total occlusion of the proximal LAD, treated successfully with a 3.0 x 22 mm resolute Onyx DES 2.  Severe stenosis of the ramus intermedius branch extending back to the left main 3.  Patent, small left circumflex 4.  Patent RCA with moderately severe bifurcation disease involving the posterior AV segment and PDA branches 5.  Moderate segmental LV systolic dysfunction with periapical akinesis and LVEF estimated at 40 to 45%, moderately elevated LVEDP consistent with acute systolic heart failure 6.  Successful placement of left internal jugular triple-lumen catheter under fluoroscopic guidance  Patient Profile     74 y.o. male status post out of hospital cardiac arrest and subsequent evidence of anterior STEMI status post DES to the LAD, ischemic cardiomyopathy with LVEF 35 to 40%, and suspected anoxic encephalopathy with VDRF.  Assessment & Plan    1.  Out of hospital cardiac arrest with anterior STEMI.   Status post DES to the proximal LAD.  Residual disease includes severe ramus intermedius stenosis extending back toward left main and moderate to severe RCA disease.  LVEF 35 to 40% by echocardiogram.  Patient completed cooling protocol and EEG (suggests profound diffuse encephalopathy).  Sedation has been discontinued.  No substantial improvement in neurological status as yet.  2.  VDRF, critical care continues to manage.  Chest x-ray consistent with fluid overload.  3.  Anoxic encephalopathy.  Continue aspirin, Brilinta, Coreg, and Lipitor.  Give Lasix 80 mg IV today, also starting low-dose losartan.  Appreciate ongoing follow-up and recommendations from critical care and neurology.  Signed, Rozann Lesches, MD  10/23/2019, 9:23 AM

## 2019-10-23 NOTE — Care Plan (Signed)
No seizures on LTM overnight. Will dc ltm. Please refer to final report for details.   Bradley Rodgers

## 2019-10-24 ENCOUNTER — Inpatient Hospital Stay (HOSPITAL_COMMUNITY): Payer: No Typology Code available for payment source

## 2019-10-24 DIAGNOSIS — Z66 Do not resuscitate: Secondary | ICD-10-CM

## 2019-10-24 LAB — BASIC METABOLIC PANEL
Anion gap: 11 (ref 5–15)
BUN: 36 mg/dL — ABNORMAL HIGH (ref 8–23)
CO2: 21 mmol/L — ABNORMAL LOW (ref 22–32)
Calcium: 8.3 mg/dL — ABNORMAL LOW (ref 8.9–10.3)
Chloride: 104 mmol/L (ref 98–111)
Creatinine, Ser: 0.91 mg/dL (ref 0.61–1.24)
GFR calc Af Amer: >60 mL/min (ref >60)
GFR calc non Af Amer: >60 mL/min (ref >60)
Glucose, Bld: 151 mg/dL — ABNORMAL HIGH (ref 70–99)
Potassium: 3.4 mmol/L — ABNORMAL LOW (ref 3.5–5.1)
Sodium: 136 mmol/L (ref 135–145)

## 2019-10-24 LAB — GLUCOSE, CAPILLARY
Glucose-Capillary: 100 mg/dL — ABNORMAL HIGH (ref 70–99)
Glucose-Capillary: 109 mg/dL — ABNORMAL HIGH (ref 70–99)
Glucose-Capillary: 112 mg/dL — ABNORMAL HIGH (ref 70–99)
Glucose-Capillary: 117 mg/dL — ABNORMAL HIGH (ref 70–99)
Glucose-Capillary: 120 mg/dL — ABNORMAL HIGH (ref 70–99)
Glucose-Capillary: 78 mg/dL (ref 70–99)

## 2019-10-24 LAB — CBC
HCT: 45.8 % (ref 39.0–52.0)
Hemoglobin: 16.1 g/dL (ref 13.0–17.0)
MCH: 30.5 pg (ref 26.0–34.0)
MCHC: 35.2 g/dL (ref 30.0–36.0)
MCV: 86.7 fL (ref 80.0–100.0)
Platelets: 219 10*3/uL (ref 150–400)
RBC: 5.28 MIL/uL (ref 4.22–5.81)
RDW: 14 % (ref 11.5–15.5)
WBC: 6.4 10*3/uL (ref 4.0–10.5)
nRBC: 0.3 % — ABNORMAL HIGH (ref 0.0–0.2)

## 2019-10-24 LAB — MAGNESIUM: Magnesium: 2.3 mg/dL (ref 1.7–2.4)

## 2019-10-24 MED ORDER — GLYCOPYRROLATE 1 MG PO TABS
1.0000 mg | ORAL_TABLET | ORAL | Status: DC | PRN
  Filled 2019-10-24: qty 1

## 2019-10-24 MED ORDER — ACETAMINOPHEN 325 MG PO TABS: 650.0000 mg | ORAL_TABLET | Freq: Four times a day (QID) | ORAL | Status: DC | PRN

## 2019-10-24 MED ORDER — POTASSIUM CHLORIDE 10 MEQ/50ML IV SOLN
10.0000 meq | INTRAVENOUS | Status: AC
  Administered 2019-10-24 (×3): 10 meq via INTRAVENOUS
  Filled 2019-10-24 (×3): qty 50

## 2019-10-24 MED ORDER — DIPHENHYDRAMINE HCL 50 MG/ML IJ SOLN: 25.0000 mg | INTRAMUSCULAR | Status: DC | PRN

## 2019-10-24 MED ORDER — POLYVINYL ALCOHOL 1.4 % OP SOLN
1.0000 [drp] | Freq: Four times a day (QID) | OPHTHALMIC | Status: DC | PRN
  Filled 2019-10-24: qty 15

## 2019-10-24 MED ORDER — EPINEPHRINE 1 MG/10ML IJ SOSY
PREFILLED_SYRINGE | INTRAMUSCULAR | Status: AC
  Filled 2019-10-24: qty 10

## 2019-10-24 MED ORDER — FENTANYL CITRATE (PF) 100 MCG/2ML IJ SOLN
25.0000 ug | INTRAMUSCULAR | Status: DC | PRN
  Administered 2019-10-25: 100 ug via INTRAVENOUS
  Filled 2019-10-24: qty 2

## 2019-10-24 MED ORDER — MIDAZOLAM HCL 2 MG/2ML IJ SOLN: 2.0000 mg | INTRAMUSCULAR | Status: DC | PRN

## 2019-10-24 MED ORDER — MIDAZOLAM HCL 2 MG/2ML IJ SOLN
2.0000 mg | Freq: Once | INTRAMUSCULAR | Status: DC
  Filled 2019-10-24: qty 2

## 2019-10-24 MED ORDER — GADOBUTROL 1 MMOL/ML IV SOLN
7.5000 mL | Freq: Once | INTRAVENOUS | Status: AC | PRN
  Administered 2019-10-24: 7.5 mL via INTRAVENOUS

## 2019-10-24 MED ORDER — GLYCOPYRROLATE 0.2 MG/ML IJ SOLN: 0.2000 mg | INTRAMUSCULAR | Status: DC | PRN

## 2019-10-24 MED ORDER — ACETAMINOPHEN 650 MG RE SUPP: 650.0000 mg | Freq: Four times a day (QID) | RECTAL | Status: DC | PRN

## 2019-10-24 MED ORDER — DEXTROSE 5 % IV SOLN: INTRAVENOUS | Status: DC

## 2019-10-24 NOTE — Progress Notes (Signed)
PCCM:  Family meeting with patients sister. Discussed current clinical status. Discussed MRI results.  They are considering comfort measures in the coming days if he continues to have no meaningful neurologic recovery.   Also code status changed to DNR.   28 mins of advanced care planning time.   Josephine Igo, DO [facility] 10/24/2019 2:57 PM

## 2019-10-24 NOTE — Progress Notes (Signed)
65mL versed wasted and witnessed in stericycle with Trudee Grip, RN.

## 2019-10-24 NOTE — Progress Notes (Signed)
Pt transported to CT with RT. Trip was uneventful.

## 2019-10-24 NOTE — Progress Notes (Signed)
eLink Physician-Brief Progress Note Patient Name: Sedric Guia DOB: Aug 21, 1945 MRN: [id number]   Date of Service  10/24/2019  HPI/Events of Note  K+ = 3.4 and Creainine = 0.91.   eICU Interventions  Will replace K+.      Intervention Category Major Interventions: Electrolyte abnormality - evaluation and management  [facility] [name] 10/24/2019, 6:59 AM

## 2019-10-24 NOTE — Progress Notes (Signed)
Pt transported to CT and back with RN without any complications

## 2019-10-24 NOTE — Progress Notes (Signed)
Pt transported to MRI via ventilator and returned to 2H24. Family at beside. No complications noted during transport.

## 2019-10-24 NOTE — Progress Notes (Signed)
Progress Note  Patient Name: Bradley Rodgers Date of Encounter: 10/24/2019  Primary Cardiologist: Sherren Mocha, MD  Subjective   Unresponsive on ventilator.  Patient with pupillary change overnight, left 7 mm per nursing with subsequent head CT showing no acute change.  Also now requiring further pressor support.  Inpatient Medications    Scheduled Meds: . artificial tears  1 application Both Eyes O9B  . aspirin  81 mg Per Tube Daily  . atorvastatin  80 mg Per Tube Daily  . carvedilol  6.25 mg Oral BID  . chlorhexidine gluconate (MEDLINE KIT)  15 mL Mouth Rinse BID  . Chlorhexidine Gluconate Cloth  6 each Topical Daily  . feeding supplement (PRO-STAT SUGAR FREE 64)  30 mL Per Tube TID  . fentaNYL (SUBLIMAZE) injection  50 mcg Intravenous Once  . heparin  5,000 Units Subcutaneous Q8H  . insulin aspart  0-9 Units Subcutaneous Q4H  . mouth rinse  15 mL Mouth Rinse 10 times per day  . metoCLOPramide (REGLAN) injection  5 mg Intravenous Q6H  . midazolam  1 mg Intravenous Once  . pantoprazole sodium  40 mg Per Tube Daily  . sodium chloride flush  10-40 mL Intracatheter Q12H  . sodium chloride flush  3 mL Intravenous Q12H  . ticagrelor  90 mg Per Tube BID   Continuous Infusions: . sodium chloride Stopped (10/23/19 1900)  . sodium chloride    . DOPamine 5 mcg/kg/min (10/24/19 0700)  . feeding supplement (VITAL AF 1.2 CAL) 55 mL/hr at 10/23/19 1100  . fentaNYL infusion INTRAVENOUS Stopped (10/23/19 2332)  . lactated ringers Stopped (10/23/19 1900)  . midazolam Stopped (10/23/19 0017)  . norepinephrine (LEVOPHED) Adult infusion 15 mcg/min (10/24/19 0700)  . potassium chloride     PRN Meds: sodium chloride, acetaminophen (TYLENOL) oral liquid 160 mg/5 mL, etomidate, fentaNYL, labetalol, midazolam, nitroGLYCERIN, sodium chloride flush, sodium chloride flush   Vital Signs    Vitals:   10/24/19 0615 10/24/19 0630 10/24/19 0645 10/24/19 0700  BP:      Pulse: 89 96 (!) 107  100  Resp: (!) 24 (!) 26 (!) 28 (!) 27  Temp: 98.2 F (36.8 C) 98.4 F (36.9 C) 98.4 F (36.9 C) 98.6 F (37 C)  TempSrc:      SpO2: 100% 100% 100% 100%  Weight:      Height:        Intake/Output Summary (Last 24 hours) at 10/24/2019 0736 Last data filed at 10/24/2019 0700 Gross per 24 hour  Intake 3460.33 ml  Output 3080 ml  Net 380.33 ml   Filed Weights   10/26/2019 1714 10/23/19 0800 10/24/19 0545  Weight: 90.7 kg 97 kg 87.7 kg    Telemetry    Sinus rhythm, PSVT also noted.  Personally reviewed.  ECG    An ECG dated 10/22/2019 was personally reviewed today and demonstrated:  Sinus rhythm with evolutionary changes of recent anterior infarct.  Physical Exam   GEN:  Elderly male on ventilator.   Neck:  No JVD. Cardiac: RRR, no gallop.  Respiratory:  Crackles have improved compared to yesterday. GI: Soft, nontender, bowel sounds present but diminished. MS: No edema; No deformity. Neuro:   No purposeful movements or following of commands.  Labs    Chemistry Recent Labs  Lab 10/16/2019 1645 10/14/2019 1727 10/22/19 0422 10/22/19 0431 10/23/19 0346 10/23/19 0346 10/23/19 0549 10/23/19 1132 10/24/19 0459  NA 140   < > 136   < > 135   < > 136  139 136  K 3.7   < > 4.1   < > 4.5   < > 4.4 3.4* 3.4*  CL 107   < > 110  --  107  --   --   --  104  CO2 14*   < > 16*  --  20*  --   --   --  21*  GLUCOSE 273*   < > 151*  --  124*  --   --   --  151*  BUN 10   < > 15  --  23  --   --   --  36*  CREATININE 1.33*   < > 1.00  --  0.80  --   --   --  0.91  CALCIUM 8.1*   < > 7.7*  --  8.3*  --   --   --  8.3*  PROT 6.2*  --   --   --   --   --   --   --   --   ALBUMIN 3.3*  --   --   --   --   --   --   --   --   AST 174*  --   --   --   --   --   --   --   --   ALT 118*  --   --   --   --   --   --   --   --   ALKPHOS 60  --   --   --   --   --   --   --   --   BILITOT 1.1  --   --   --   --   --   --   --   --   GFRNONAA 53*   < > >60  --  >60  --   --   --  >60  GFRAA  >60   < > >60  --  >60  --   --   --  >60  ANIONGAP 19*   < > 10  --  8  --   --   --  11   < > = values in this interval not displayed.     Hematology Recent Labs  Lab 11/02/2019 2010 10/13/2019 2100 10/22/19 0422 10/22/19 0431 10/23/19 0549 10/23/19 1132 10/24/19 0459  WBC 21.1*  --  19.2*  --   --   --  6.4  RBC 5.89*  --  5.36  --   --   --  5.28  HGB 17.9*   < > 16.1   < > 13.9 13.6 16.1  HCT 52.0   < > 46.7   < > 41.0 40.0 45.8  MCV 88.3  --  87.1  --   --   --  86.7  MCH 30.4  --  30.0  --   --   --  30.5  MCHC 34.4  --  34.5  --   --   --  35.2  RDW 13.2  --  13.7  --   --   --  14.0  PLT 291  --  262  --   --   --  219   < > = values in this interval not displayed.    Cardiac Enzymes Recent Labs  Lab 10/10/2019 1645 10/18/2019 2010  TROPONINIHS 9,239* >27,000*    Radiology  CT HEAD WO CONTRAST  Result Date: 10/24/2019 CLINICAL DATA:  Dilated left pupil. EXAM: CT HEAD WITHOUT CONTRAST TECHNIQUE: Contiguous axial images were obtained from the base of the skull through the vertex without intravenous contrast. COMPARISON:  11/03/2019 FINDINGS: Brain: There is no mass, hemorrhage or extra-axial collection. The size and configuration of the ventricles and extra-axial CSF spaces are normal. There is hypoattenuation of the white matter, most commonly indicating chronic small vessel disease. Vascular: Unchanged mildly hyperdense appearance of the right MCA. No intracranial atherosclerosis. Skull: The visualized skull base, calvarium and extracranial soft tissues are normal. Sinuses/Orbits: No fluid levels or advanced mucosal thickening of the visualized paranasal sinuses. No mastoid or middle ear effusion. The orbits are normal. IMPRESSION: 1. No acute intracranial abnormality. 2. Chronic small vessel disease. Electronically Signed   By: Ulyses Jarred M.D.   On: 10/24/2019 05:48   DG CHEST PORT 1 VIEW  Result Date: 10/23/2019 CLINICAL DATA:  Hemoptysis, STEMI, post CPR. EXAM:  PORTABLE CHEST 1 VIEW COMPARISON:  Chest x-ray dated 11/03/2019. FINDINGS: Tubes and lines appear stable in position. Central pulmonary vascular congestion. New streaky opacities at the medial aspects of the LEFT lung base and within the RIGHT suprahilar lung. Remainder of the lungs remain clear. No pneumothorax is seen. No displaced rib fracture seen. IMPRESSION: 1. Central pulmonary vascular congestion indicating volume overload/CHF. 2. New streaky opacities at the medial aspects of the LEFT lung base and within the RIGHT suprahilar lung, could represent asymmetric edema, atelectasis or pneumonia. 3. Tubes and lines appear stable in position. Electronically Signed   By: Franki Cabot M.D.   On: 10/23/2019 07:31    Cardiac Studies   Cardiac catheterization 8/46/9629:  LV end diastolic pressure is moderately elevated.  The left ventricular ejection fraction is 35-45% by visual estimate.  Prox RCA lesion is 40% stenosed.  RPAV lesion is 50% stenosed.  RPDA lesion is 75% stenosed.  Mid LM to Dist LM lesion is 40% stenosed.  Ramus lesion is 80% stenosed.  Prox LAD to Mid LAD lesion is 100% stenosed.  A drug-eluting stent was successfully placed using a STENT RESOLUTE ONYX 3.0X22.  Post intervention, there is a 0% residual stenosis.   1.  Acute total occlusion of the proximal LAD, treated successfully with a 3.0 x 22 mm resolute Onyx DES 2.  Severe stenosis of the ramus intermedius branch extending back to the left main 3.  Patent, small left circumflex 4.  Patent RCA with moderately severe bifurcation disease involving the posterior AV segment and PDA branches 5.  Moderate segmental LV systolic dysfunction with periapical akinesis and LVEF estimated at 40 to 45%, moderately elevated LVEDP consistent with acute systolic heart failure 6.  Successful placement of left internal jugular triple-lumen catheter under fluoroscopic guidance  Patient Profile     74 y.o. male status post out of  hospital cardiac arrest and subsequent evidence of anterior STEMI status post DES to the LAD, ischemic cardiomyopathy with LVEF 35 to 40%, and suspected anoxic encephalopathy with VDRF.  Assessment & Plan    1.  Out of hospital cardiac arrest with anterior STEMI.  Status post DES to the proximal LAD.  Residual disease includes severe ramus intermedius stenosis extending back toward left main and moderate to severe RCA disease.  LVEF 35 to 40% by echocardiogram.  Patient completed cooling protocol and EEG (suggests profound diffuse encephalopathy).  Sedation has been discontinued and still no improvement in neurological status.  He had further hemodynamic instability yesterday,  losartan discontinued and now back on pressors.  Urine output did increase yesterday after IV Lasix but intake and output still fairly even.  2.  VDRF, critical care continues to manage.  Follow-up chest x-ray pending.  3.  Anoxic encephalopathy.  Repeat head CT overnight showed no acute change.  Continue aspirin, Brilinta, and Lipitor.  Holding Coreg and losartan.  Now on Levophed and dopamine for hemodynamic support.  Still full ventilatory support off sedatives.  Unfortunately, overall prognosis is poor, appreciate ongoing input from critical care team and neurology   Signed, Rozann Lesches, MD  10/24/2019, 7:36 AM

## 2019-10-24 NOTE — Progress Notes (Signed)
NAME:  Bradley Rodgers, MRN:  [id number], DOB:  09-26-1945, LOS: 4 ADMISSION DATE:  11/08/2019, CONSULTATION DATE:  10/12/2019 REFERRING MD:  Dr. Excell Seltzer, CHIEF COMPLAINT:  Cardiac Arrest  Brief History   74 year old male with unknown history presenting with out of hospital cardiac arrest with bystander CPR, ROSC after 3 rounds ACLS, found to have anterolateral STEMI s/p PCI and DES to LAD.  To ICU for TTM.   Past Medical History  Unknown  Significant Hospital Events   5/12 Admit post arrest  Consults:  PCCM  Procedures:  ETT 5/12 >> L IJ CVL 5/12 >> L Radial Aline 5/12 >>  Significant Diagnostic Tests:  5/12 LHC >>   LV end diastolic pressure is moderately elevated.  The left ventricular ejection fraction is 35-45% by visual estimate.  Prox RCA lesion is 40% stenosed.  RPAV lesion is 50% stenosed.  RPDA lesion is 75% stenosed.  Mid LM to Dist LM lesion is 40% stenosed.  Ramus lesion is 80% stenosed.  Prox LAD to Mid LAD lesion is 100% stenosed.  A drug-eluting stent was successfully placed using a STENT RESOLUTE ONYX 3.0X22.  Post intervention, there is a 0% residual stenosis.   1.  Acute total occlusion of the proximal LAD, treated successfully with a 3.0 x 22 mm resolute Onyx DES 2.  Severe stenosis of the ramus intermedius branch extending back to the left main 3.  Patent, small left circumflex 4.  Patent RCA with moderately severe bifurcation disease involving the posterior AV segment and PDA branches 5.  Moderate segmental LV systolic dysfunction with periapical akinesis and LVEF estimated at 40 to 45%, moderately elevated LVEDP consistent with acute systolic heart failure 6.  Successful placement of left internal jugular triple-lumen catheter under fluoroscopic guidance  5/12 CTH >> No acute intracranial abnormality noted.  5/13 EEG >> no seizures  516 CT head: No acute abnormality mild chronic microvascular disease.  Micro Data:  COVID 5/12 >>  negative MRSA PCR 5/12 >> negative  Antimicrobials:    Interim history/subjective:   Still on vasopressors.  Mechanical life support.  Critically ill  Objective   Blood pressure 135/85, pulse (!) 120, temperature (!) 100.4 F (38 C), resp. rate (!) 22, height 6\' 1"  (1.854 m), weight 87.7 kg, SpO2 93 %. CVP:  [0 mmHg-10 mmHg] 10 mmHg  Vent Mode: SIMV;PSV;PRVC FiO2 (%):  [80 %-100 %] 80 % Set Rate:  [24 bmp] 24 bmp Vt Set:  [450 mL] 450 mL PEEP:  [10 cmH20] 10 cmH20 Pressure Support:  [15 cmH20] 15 cmH20 Plateau Pressure:  [11 cmH20-24 cmH20] 24 cmH20   Intake/Output Summary (Last 24 hours) at 10/24/2019 0916 Last data filed at 10/24/2019 0900 Gross per 24 hour  Intake 3668.14 ml  Output 2965 ml  Net 703.14 ml   Filed Weights   10/30/2019 1714 10/23/19 0800 10/24/19 0545  Weight: 90.7 kg 97 kg 87.7 kg    Examination: GEN: Elderly male intubated mechanical life support HEENT: Endotracheal tube in place CV: Regular rate rhythm, S1-S2, systolic murmur PULM: Bilateral mechanical ventilated breath sounds GI: Soft, nondistended nontender EXT: Trace dependent edema NEURO: Moves all 4 extremities spontaneously PSYCH: RASS goal 0 to -1 SKIN: Multiple skin lesions  Labs reviewed 1 CT head reviewed  Chest x-ray reviewed yesterday with central vascular congestion.  Resolved Hospital Problem list     Assessment & Plan:   Cardiac Arrest Acute Anterolateral STEMI s/p PCI with DES to LAD Bradycardia with Pauses (5/13 am)  Cardiogenic shock, heart failure with reduced ejection fraction -Antiplatelets, statins per cardiology services. -Beta-blocker ARB held due to shock state worsening. -Holding diuresis at this time to -Continue norepinephrine/dopamine.  Would recommend tapering off dopamine first. -Goal mean arterial pressure greater than 65 mmHg  Acute metabolic encephalopathy Likely anoxic brain injury Early hours of 10/24/2019 noted by nursing -CT head negative Continue  to observe -LTV EEG with no evidence of seizures -We appreciate neurology's input. -MRI imaging of the brain has been ordered. -Consult placed to palliative care  Acute Hypoxic Respiratory Failure  Mental status precludes liberation from vent -Full mechanical life support Adult ventilator protocol. VAP prophylaxis  Oliguria- Continued observe urine output  Best practice:  Diet: start TF Pain/Anxiety/Delirium protocol (if indicated): off sedation  VAP protocol (if indicated): yes DVT prophylaxis: heparin GI prophylaxis: PPI Glucose control: CBG q 4 Mobility: BR Code Status: Full  Family Communication: I called and spoke with the patient's sister via phone this morning.  She was appreciative of the update. Disposition: ICU   This patient is critically ill with multiple organ system failure; which, requires frequent high complexity decision making, assessment, support, evaluation, and titration of therapies. This was completed through the application of advanced monitoring technologies and extensive interpretation of multiple databases. During this encounter critical care time was devoted to patient care services described in this note for 34 minutes.  [facility] 10/24/2019 9:16 AM

## 2019-10-24 NOTE — Progress Notes (Signed)
Notified CDS of impending death. CDS rep will call foro more information.   Ref # [id number]

## 2019-10-24 NOTE — Progress Notes (Signed)
eLink Physician-Brief Progress Note Patient Name: Bradley Rodgers DOB: April 27, 1946 MRN: [id number]   Date of Service  10/24/2019  HPI/Events of Note  Sister at bedside. She states they would want to proceed with withdrawal of life support tonight and to make him comfortable.  eICU Interventions  Ordered comfort came measures and placed CCM withdrawal of life support order set     Intervention Category Intermediate Interventions: Other: Minor Interventions: Communication with other healthcare providers and/or family  Darl Pikes 10/24/2019, 11:35 PM

## 2019-10-24 NOTE — Progress Notes (Signed)
eLink Physician-Brief Progress Note Patient Name: Bradley Rodgers DOB: 20-Aug-1945 MRN: [id number]   Date of Service  10/24/2019  HPI/Events of Note  Nursing reports L pupil now dilated at 7 mm.  eICU Interventions  Will order: 1. Head CT Scan without contrast STAT.     Intervention Category Major Interventions: Other:  [name] 10/24/2019, 4:51 AM

## 2019-10-25 MED FILL — Tirofiban HCl in NaCl 0.9% IV Soln 5 MG/100ML (Base Equiv): INTRAVENOUS | Qty: 100 | Status: AC

## 2019-10-26 ENCOUNTER — Telehealth: Payer: Self-pay | Admitting: Cardiovascular Disease

## 2019-10-26 NOTE — Telephone Encounter (Signed)
New Message  Received a phone call from [place] with [facility] and they are requesting that Dr. Excell Seltzer sign the death certificate for the deceased due to him passing away at [facility].  Please call

## 2019-10-26 NOTE — Telephone Encounter (Signed)
Have contacted Thurston Pounds at [facility].  He has been made aware that Dr. Excell Seltzer agreed to sign the death certificate and he will be back in the office for a short time on Friday morning.  Thurston Pounds will get it to the office between now and Thursday afternoon for him to sign on Friday.  He was grateful for the help.

## 2019-10-26 NOTE — Telephone Encounter (Signed)
I can sign it. I'm at [facility] tomorrow and Thursday. Just let me know how to do and I'll take care of it. thx

## 2019-10-27 LAB — POCT I-STAT 7, (LYTES, BLD GAS, ICA,H+H)
Acid-base deficit: 9 mmol/L — ABNORMAL HIGH (ref 0.0–2.0)
Bicarbonate: 15.8 mmol/L — ABNORMAL LOW (ref 20.0–28.0)
Calcium, Ion: 0.95 mmol/L — ABNORMAL LOW (ref 1.15–1.40)
HCT: 50 % (ref 39.0–52.0)
Hemoglobin: 17 g/dL (ref 13.0–17.0)
O2 Saturation: 99 %
Patient temperature: 32.5
Potassium: >8.5 mmol/L (ref 3.5–5.1)
Sodium: 134 mmol/L — ABNORMAL LOW (ref 135–145)
TCO2: 17 mmol/L — ABNORMAL LOW (ref 22–32)
pCO2 arterial: 25.8 mmHg — ABNORMAL LOW (ref 32.0–48.0)
pH, Arterial: 7.374 (ref 7.350–7.450)
pO2, Arterial: 138 mmHg — ABNORMAL HIGH (ref 83.0–108.0)

## 2019-10-28 NOTE — Telephone Encounter (Signed)
Paperwork received. Will review with Dr. Excell Seltzer tomorrow.

## 2019-10-29 ENCOUNTER — Telehealth: Payer: Self-pay | Admitting: Cardiovascular Disease

## 2019-10-29 NOTE — Telephone Encounter (Signed)
Death certificate signed and given to Medical Records.

## 2019-10-29 NOTE — Telephone Encounter (Signed)
Spoke with Amil Amen at [facility]. Informed her that death certificate was signed and ready for pick up. Copy also faxed to funeral. 10/29/19 vlm

## 2019-11-09 NOTE — Procedures (Signed)
Extubation Procedure Note  Patient Details:   Name: Bradley Rodgers DOB: 1945-10-30 MRN: [id number]   Airway Documentation:  Airway (Active)  Secured at (cm) 26 cm 10/24/19 1940  Measured From Lips 10/24/19 1940  Secured Location Center 10/24/19 1940  Secured By [employer] 10/24/19 1940  Tube Holder Repositioned Yes 10/24/19 1940  Cuff Pressure (cm H2O) 30 cm H2O 10/24/19 1940  Site Condition Dry 10/24/19 1940   Vent end date: (not recorded) Vent end time: (not recorded)   Evaluation  O2 sats: currently acceptable Complications: No apparent complications Patient did not tolerate procedure well. Bilateral Breath Sounds: Diminished, Coarse crackles   No   Pt terminally extubated per family wishes and MD order  Lang Snow 10/30/2019, 2:07 AM

## 2019-11-09 NOTE — Progress Notes (Signed)
200 ml fentanyl wasted with Sheppard Penton RN

## 2019-11-09 NOTE — Progress Notes (Signed)
Notified CDS of time of death. Possible tissue donation. Ok to release to morgue. Do Not send to funeral home until able to CDS contacts family.

## 2019-11-09 NOTE — Progress Notes (Signed)
While on the unit attending another patient, Chaplain was approached by nurse advising family had made decision to move patient to comfort care.  Nurse requested a visit with the patient's sister who was at his bedside.  Chaplain met patient's sister and her daughter and initiated a relationship of care and concern.  Sister expressed how the time had come to make this difficult decision.  And while she was comfortable with her decision, it was also very hard. Sister believed she had received confirmation from God that this was the time.  Chaplain validated that spiritual communication with God.   Chaplain attended family while patient was extubated and abided with family at bedside until the end of the patient's life.  De Burrs Chaplain Resident

## 2019-11-09 NOTE — Death Summary Note (Signed)
Death Summary    Patient ID: Bradley Rodgers MRN: [id number]; DOB: Feb 09, 1946  Admit Date: 11/10/19 Date of Death: 11-14-2019  Primary Care Provider: Patient, No Pcp Per  Primary Cardiologist: Tonny Bollman, MD  Primary Electrophysiologist:  None   Discharge Diagnoses    Principal Problem:   Cardiac arrest [facility]) Active Problems:   STEMI involving left anterior descending coronary artery (HCC)   Endotracheally intubated   Acute respiratory failure with hypoxia and hypercapnia (HCC)   Cardiogenic shock (HCC)   DNR (do not resuscitate)    Diagnostic Studies/Procedures    Cardiac catheterization/PCI _____________   History of Present Illness     Bradley Rodgers is a 74 y.o. male with out of hospital cardiac arrest and subsequent evidence of anterior STEMI status post DES to the LAD, ischemic cardiomyopathy with LVEF 35 to 40%, and suspected anoxic encephalopathy with VDRF.  Hospital Course   The patient suffers an out of hospital cardiac arrest 11-10-19.  He is found to have anterior ST elevation on his postresuscitation ECG and is taken emergently for cardiac catheterization and PCI of the LAD.  He was treated with a 3.0 x 22 mm resolute Onyx DES.  Hypothermia protocol was initiated.  Stat CT of the brain showed no evidence of acute intracranial hemorrhage.  The patient was maintained on mechanical life support throughout his hospitalization.  He unfortunately suffered severe anoxic brain injury and remained comatose throughout the remainder of his hospitalization.  Neurology consulted on his case and after extensive discussions with family, the patient was made comfort care.  He was terminally extubated on November 14, 2019 and expired shortly thereafter.  Consultants: Neurology, critical care medicine _____________  Labs & Radiologic Studies    CBC No results for input(s): WBC, NEUTROABS, HGB, HCT, MCV, PLT in the last 72 hours. Basic Metabolic Panel No results for  input(s): NA, K, CL, CO2, GLUCOSE, BUN, CREATININE, CALCIUM, MG, PHOS in the last 72 hours. Liver Function Tests No results for input(s): AST, ALT, ALKPHOS, BILITOT, PROT, ALBUMIN in the last 72 hours. No results for input(s): LIPASE, AMYLASE in the last 72 hours. High Sensitivity Troponin:   Recent Labs  Lab Nov 10, 2019 1645 2019/11/10 2010  TROPONINIHS 9,239* >27,000*    BNP Invalid input(s): POCBNP D-Dimer No results for input(s): DDIMER in the last 72 hours. Hemoglobin A1C No results for input(s): HGBA1C in the last 72 hours. Fasting Lipid Panel No results for input(s): CHOL, HDL, LDLCALC, TRIG, CHOLHDL, LDLDIRECT in the last 72 hours. Thyroid Function Tests No results for input(s): TSH, T4TOTAL, T3FREE, THYROIDAB in the last 72 hours.  Invalid input(s): FREET3 _____________  Varney Biles Abd 1 View  Result Date: 11-14-2019 CLINICAL DATA:  Cardiac arrest. STEMI. Evaluate for metal prior to MRI. EXAM: ABDOMEN - 1 VIEW COMPARISON:  None. FINDINGS: No metallic foreign body is seen within the visualized portion of the abdomen. Lateral portion of the RIGHT abdomen is excluded. Enteric tube is appropriately positioned in the stomach. Visualized bowel gas pattern is nonobstructive. Visualized osseous structures are unremarkable. IMPRESSION: 1. No metallic foreign body is seen within the visualized portion of the abdomen. 2. Enteric tube is appropriately positioned in the stomach. Electronically Signed   By: Bary Richard M.D.   On: 14-Nov-2019 10:57   CT HEAD WO CONTRAST  Result Date: November 14, 2019 CLINICAL DATA:  Dilated left pupil. EXAM: CT HEAD WITHOUT CONTRAST TECHNIQUE: Contiguous axial images were obtained from the base of the skull through the vertex without intravenous contrast. COMPARISON:  November 10, 2019  FINDINGS: Brain: There is no mass, hemorrhage or extra-axial collection. The size and configuration of the ventricles and extra-axial CSF spaces are normal. There is hypoattenuation of the white  matter, most commonly indicating chronic small vessel disease. Vascular: Unchanged mildly hyperdense appearance of the right MCA. No intracranial atherosclerosis. Skull: The visualized skull base, calvarium and extracranial soft tissues are normal. Sinuses/Orbits: No fluid levels or advanced mucosal thickening of the visualized paranasal sinuses. No mastoid or middle ear effusion. The orbits are normal. IMPRESSION: 1. No acute intracranial abnormality. 2. Chronic small vessel disease. Electronically Signed   By: Deatra Robinson M.D.   On: 10/24/2019 05:48   CT Head Wo Contrast  Result Date: 11-02-19 CLINICAL DATA:  Status post CPR EXAM: CT HEAD WITHOUT CONTRAST TECHNIQUE: Contiguous axial images were obtained from the base of the skull through the vertex without intravenous contrast. COMPARISON:  None. FINDINGS: Brain: No evidence of acute infarction, hemorrhage, hydrocephalus, extra-axial collection or mass lesion/mass effect. Vascular: No hyperdense vessel or unexpected calcification. Skull: Normal. Negative for fracture or focal lesion. Sinuses/Orbits: No acute finding. Other: None. IMPRESSION: No acute intracranial abnormality noted. Electronically Signed   By: Alcide Clever M.D.   On: 11-02-19 17:10   MR BRAIN W WO CONTRAST  Result Date: 10/24/2019 CLINICAL DATA:  Cardiac arrest 4 days ago.  Anoxic brain injury. EXAM: MRI HEAD WITHOUT AND WITH CONTRAST TECHNIQUE: Multiplanar, multiecho pulse sequences of the brain and surrounding structures were obtained without and with intravenous contrast. CONTRAST:  7.9mL GADAVIST GADOBUTROL 1 MMOL/ML IV SOLN COMPARISON:  Head CT same day and 11-02-2019 FINDINGS: Brain: Diffusion imaging shows some hypoxic ischemic injury to the caudate nuclei and possibly to a lesser extent the putamen. No other acute insult identified. Otherwise, the brainstem and cerebellum are normal. Cerebral hemispheres show minor chronic small-vessel change of the white matter. No cortical  or large vessel territory insult. No mass lesion, hemorrhage, hydrocephalus or extra-axial collection. Vascular: Major vessels at the base of the brain show flow. Skull and upper cervical spine: Negative Sinuses/Orbits: Clear/normal Other: None IMPRESSION: The study shows mild restricted diffusion abnormality affecting the caudate nuclei and possibly to a lesser extent the putamena. Insult to the corpus striatum is one of the manifestations of hypoxic ischemic brain injury. No other sign of acute insult. Otherwise there is mild age related atrophy and chronic small-vessel change of the white matter. Electronically Signed   By: Paulina Fusi M.D.   On: 10/24/2019 12:21   CARDIAC CATHETERIZATION  Result Date: 11-02-19  LV end diastolic pressure is moderately elevated.  The left ventricular ejection fraction is 35-45% by visual estimate.  Prox RCA lesion is 40% stenosed.  RPAV lesion is 50% stenosed.  RPDA lesion is 75% stenosed.  Mid LM to Dist LM lesion is 40% stenosed.  Ramus lesion is 80% stenosed.  Prox LAD to Mid LAD lesion is 100% stenosed.  A drug-eluting stent was successfully placed using a STENT RESOLUTE ONYX 3.0X22.  Post intervention, there is a 0% residual stenosis.  1.  Acute total occlusion of the proximal LAD, treated successfully with a 3.0 x 22 mm resolute Onyx DES 2.  Severe stenosis of the ramus intermedius branch extending back to the left main 3.  Patent, small left circumflex 4.  Patent RCA with moderately severe bifurcation disease involving the posterior AV segment and PDA branches 5.  Moderate segmental LV systolic dysfunction with periapical akinesis and LVEF estimated at 40 to 45%, moderately elevated LVEDP consistent with acute  systolic heart failure 6.  Successful placement of left internal jugular triple-lumen catheter under fluoroscopic guidance Recommendations: Discussed with critical care team.  Patient should be initiated on hypothermia protocol.  He is loaded with 324  mg of aspirin and 180 mg of ticagrelor in the cardiac catheterization lab.  He should continue on Aggrastat infusion for 6 hours.  Prognosis is guarded after out of hospital cardiac arrest.   DG CHEST PORT 1 VIEW  Result Date: 10/23/2019 CLINICAL DATA:  Hemoptysis, STEMI, post CPR. EXAM: PORTABLE CHEST 1 VIEW COMPARISON:  Chest x-ray dated 10/12/2019. FINDINGS: Tubes and lines appear stable in position. Central pulmonary vascular congestion. New streaky opacities at the medial aspects of the LEFT lung base and within the RIGHT suprahilar lung. Remainder of the lungs remain clear. No pneumothorax is seen. No displaced rib fracture seen. IMPRESSION: 1. Central pulmonary vascular congestion indicating volume overload/CHF. 2. New streaky opacities at the medial aspects of the LEFT lung base and within the RIGHT suprahilar lung, could represent asymmetric edema, atelectasis or pneumonia. 3. Tubes and lines appear stable in position. Electronically Signed   By: Franki Cabot M.D.   On: 10/23/2019 07:31   DG CHEST PORT 1 VIEW  Result Date: 11/02/2019 CLINICAL DATA:  Status post endotracheal intubation EXAM: PORTABLE CHEST 1 VIEW COMPARISON:  Film from earlier in the same day. FINDINGS: Previously seen endotracheal tube is been withdrawn and now lies 3.4 cm above the carina in satisfactory position. Left jugular central line has been placed in the interval at the cavoatrial junction. Gastric catheter extends into the stomach. Cardiac shadow is within normal limits. No focal infiltrate or effusion is seen. Previously seen upper lobe atelectasis has cleared. No pneumothorax is noted. IMPRESSION: Tubes and lines in satisfactory position. Improved inspiratory effort when compared with the prior exam. Electronically Signed   By: Inez Catalina M.D.   On: 10/13/2019 21:17   DG Chest Port 1 View  Result Date: 10/19/2019 CLINICAL DATA:  Status post intubation. EXAM: PORTABLE CHEST 1 VIEW COMPARISON:  None. FINDINGS: An  endotracheal tube is seen with its distal tip approximately 1.3 cm from the carina. A nasogastric tube is noted with its distal end seen within the body of the stomach. Mildly decreased lung volumes are seen with mild, diffuse chronic appearing increased lung markings. Mild atelectasis is seen along the medial aspect of the right apex. There is no evidence of acute infiltrate, pleural effusion or pneumothorax. The heart size and mediastinal contours are within normal limits. Multilevel degenerative changes are noted throughout the thoracic spine. IMPRESSION: 1. Endotracheal tube and nasogastric tube positioning, as described above. 2. Mild right upper lobe atelectasis. Electronically Signed   By: Virgina Norfolk M.D.   On: 10/21/2019 17:04   EEG adult  Result Date: 10/21/2019 Lora Havens, MD     10/21/2019  8:59 AM Patient Name: Takuma Cifelli MRN: [id number] Epilepsy Attending: Lora Havens Referring Physician/Provider: Jennelle Human, NP Date: 10/27/2019 Duration: 21.04 mins Patient history: 75yo M s/p cardiac arrest on TTM. EEG to evaluate for seizure. Level of alertness: comatose AEDs during EEG study: versed Technical aspects: This EEG study was done with scalp electrodes positioned according to the 10-20 International system of electrode placement. Electrical activity was acquired at a sampling rate of 500Hz  and reviewed with a high frequency filter of 70Hz  and a low frequency filter of 1Hz . EEG data were recorded continuously and digitally stored. DESCRIPTION: EEG showed continuous generalized background attenuation as well as intermittent  generalized 3-5hz  theta-delta slowing. EEG was reactive to tactile stimulation. Hyperventilation and photic stimulation were not performed. ABNORMALITY - Background attenuation, generalized - Intermittent slow, generalized IMPRESSION: This study is suggestive of profound diffuse encephalopathy, non specific to etiology. No seizures or epileptiform discharges  were seen throughout the recording. Priyanka Annabelle Harman   Overnight EEG with video  Result Date: 10/21/2019 Charlsie Quest, MD     10/22/2019  9:12 AM Patient Name: Jazon Jipson MRN: [id number] Epilepsy Attending: Charlsie Quest Referring Physician/Provider: Selmer Dominion, NP Duration: 10/09/2019 2206 to 10/21/2019 2206  Patient history: 73yo M s/p cardiac arrest on TTM. EEG to evaluate for seizure.  Level of alertness: comatose  AEDs during EEG study: versed  Technical aspects: This EEG study was done with scalp electrodes positioned according to the 10-20 International system of electrode placement. Electrical activity was acquired at a sampling rate of 500Hz  and reviewed with a high frequency filter of 70Hz  and a low frequency filter of 1Hz . EEG data were recorded continuously and digitally stored.  DESCRIPTION: EEG initially showed continuous generalized background attenuation lasting 3-5 seconds. Intermittent generalized 3-5hz  theta-delta slowing was also noted which at times appeared sharply contoured. Throughout the day gradually EEG showed longer bursts of slowing lasting 5-7 seconds and intermittent background attentuation lasting 2-4 seconds.. EEG was reactive to tactile stimulation. Hyperventilation and photic stimulation were not performed.  ABNORMALITY - Background attenuation, generalized - Intermittent slow, generalized  IMPRESSION: This study is suggestive of profound diffuse encephalopathy, non specific to etiology. No seizures or epileptiform discharges were seen throughout the recording. EEG appears to be improving throughout the study.   ECHOCARDIOGRAM COMPLETE  Result Date: 10/21/2019    ECHOCARDIOGRAM REPORT   Patient Name:   ROXANNE ORNER Date of Exam: 10/21/2019 Medical Rec #:  10/23/2019        Height:       73.0 in Accession #:    [id number] Starks       Weight:       200.0 lb Date of Birth:  [birth year]        BSA:          2.152 m Patient Age:    73 years          BP:           99/70 mmHg Patient Gender: M                HR:           59 bpm. Exam Location:  Inpatient Procedure: 2D Echo Indications:    acute systolic chf 428.21  History:        Patient has no prior history of Echocardiogram examinations.                 Cardiac arrest; CAD.  Sonographer:    [id number] Referring Phys: [id number] Reginald Weida  Sonographer Comments: Echo performed with patient supine and on artificial respirator. IMPRESSIONS  1. Left ventricular ejection fraction, by estimation, is 35 to 40%. The left ventricle has moderately decreased function. The left ventricle demonstrates regional wall motion abnormalities (see scoring diagram/findings for description). Left ventricular  diastolic parameters are consistent with Grade I diastolic dysfunction (impaired relaxation).  2. Right ventricular systolic function is mildly reduced. The right ventricular size is normal. There is normal pulmonary artery systolic pressure.  3. The mitral valve is normal in structure. Trivial mitral valve regurgitation. No evidence of mitral stenosis.  4. The aortic valve  is tricuspid. Aortic valve regurgitation is not visualized. No aortic stenosis is present. Comparison(s): No prior Echocardiogram. Conclusion(s)/Recommendation(s): LV dysfunction with regional wall motion abnormalities as noted. FINDINGS  Left Ventricle: Left ventricular ejection fraction, by estimation, is 35 to 40%. The left ventricle has moderately decreased function. The left ventricle demonstrates regional wall motion abnormalities. The left ventricular internal cavity size was normal in size. There is no left ventricular hypertrophy. Left ventricular diastolic parameters are consistent with Grade I diastolic dysfunction (impaired relaxation).  LV Wall Scoring: The mid and distal anterior wall, mid and distal anterior septum, apical inferior segment, and apex are akinetic. The basal anteroseptal segment, apical lateral segment, mid inferoseptal  segment, basal anterior segment, and mid inferior segment are hypokinetic. The antero-lateral wall, posterior wall, basal inferior segment, and basal inferoseptal segment are normal. Right Ventricle: The right ventricular size is normal. No increase in right ventricular wall thickness. Right ventricular systolic function is mildly reduced. There is normal pulmonary artery systolic pressure. The tricuspid regurgitant velocity is 2.04 m/s, and with an assumed right atrial pressure of 8 mmHg, the estimated right ventricular systolic pressure is 24.6 mmHg. Left Atrium: Left atrial size was normal in size. Right Atrium: Right atrial size was normal in size. Pericardium: A small pericardial effusion is present. Mitral Valve: The mitral valve is normal in structure. There is mild thickening of the mitral valve leaflet(s). Trivial mitral valve regurgitation. No evidence of mitral valve stenosis. Tricuspid Valve: The tricuspid valve is normal in structure. Tricuspid valve regurgitation is mild . No evidence of tricuspid stenosis. Aortic Valve: The aortic valve is tricuspid. Aortic valve regurgitation is not visualized. No aortic stenosis is present. Mild aortic valve annular calcification. Pulmonic Valve: The pulmonic valve was not well visualized. Pulmonic valve regurgitation is not visualized. No evidence of pulmonic stenosis. Aorta: The aortic root is normal in size and structure. Venous: IVC assessment for right atrial pressure unable to be performed due to mechanical ventilation. IAS/Shunts: No atrial level shunt detected by color flow Doppler.  LEFT VENTRICLE PLAX 2D LVIDd:         4.90 cm      Diastology LVIDs:         3.90 cm      LV e' lateral:   4.90 cm/s LV PW:         1.10 cm      LV E/e' lateral: 8.7 LV IVS:        0.90 cm      LV e' medial:    3.70 cm/s LVOT diam:     1.80 cm      LV E/e' medial:  11.5 LV SV:         48 LV SV Index:   22 LVOT Area:     2.54 cm  LV Volumes (MOD) LV vol d, MOD A2C: 96.0 ml LV  vol d, MOD A4C: 100.0 ml LV vol s, MOD A2C: 63.9 ml LV vol s, MOD A4C: 55.4 ml LV SV MOD A2C:     32.1 ml LV SV MOD A4C:     100.0 ml LV SV MOD BP:      37.9 ml RIGHT VENTRICLE RV S prime:     12.30 cm/s TAPSE (M-mode): 1.4 cm LEFT ATRIUM             Index       RIGHT ATRIUM           Index LA diam:  3.30 cm 1.53 cm/m  RA Area:     14.60 cm LA Vol (A2C):   40.4 ml 18.78 ml/m RA Volume:   36.10 ml  16.78 ml/m LA Vol (A4C):   50.0 ml 23.24 ml/m LA Biplane Vol: 45.5 ml 21.15 ml/m  AORTIC VALVE LVOT Vmax:   90.10 cm/s LVOT Vmean:  57.300 cm/s LVOT VTI:    0.187 m  AORTA Ao Root diam: 2.80 cm MITRAL VALVE               TRICUSPID VALVE MV Area (PHT): 2.37 cm    TR Peak grad:   16.6 mmHg MV Decel Time: 320 msec    TR Vmax:        204.00 cm/s MV E velocity: 42.40 cm/s MV A velocity: 52.30 cm/s  SHUNTS MV E/A ratio:  0.81        Systemic VTI:  0.19 m                            Systemic Diam: 1.80 cm Jodelle Red MD Electronically signed by Jodelle Red MD Signature Date/Time: 10/21/2019/4:10:04 PM    Final     Duration of Death Summary Encounter   Greater than 30 minutes including physician time.  Signed, Tonny Bollman, MD 10/27/2019, 4:33 PM

## 2019-11-09 NOTE — Progress Notes (Signed)
Patient was increasingly hypotensive with a systolic in the 60's despite intervention. Patient's sister, Alvino Chapel, was updated on his condition and requested to come be at the bedside. When she arrived, she requested that he be made comfort care and palliatively extubated. CCM notified.

## 2019-11-09 DEATH — deceased

## 2020-09-28 IMAGING — DX DG CHEST 1V PORT
1 series · 1 of 1 positions shown · non-contrast
Comparison: Film from earlier in the same day.

CLINICAL DATA: Status post endotracheal intubation

EXAM:
PORTABLE CHEST 1 VIEW

[chest]
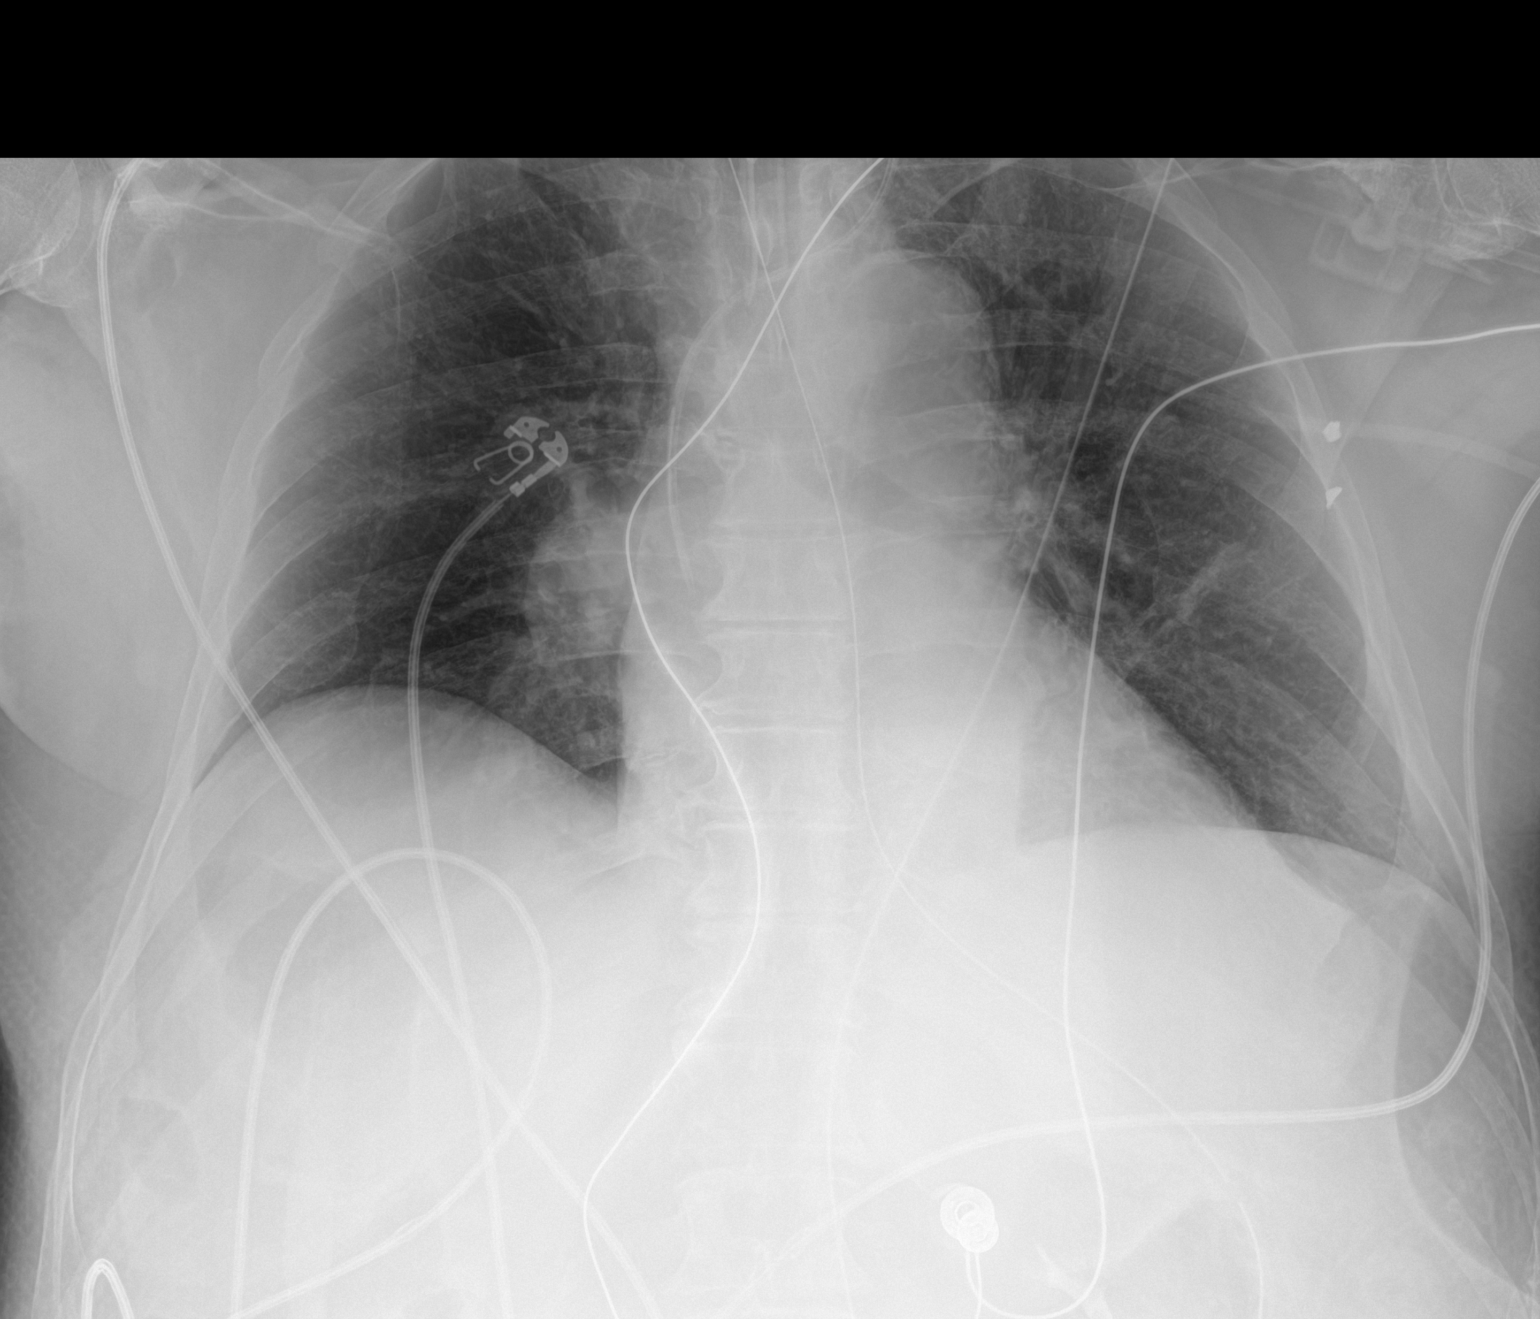

[1 of 1 positions shown; findings below may reference images not displayed]

FINDINGS: Previously seen endotracheal tube is been withdrawn and now lies
cm above the carina in satisfactory position. Left jugular central
line has been placed in the interval at the cavoatrial junction.
Gastric catheter extends into the stomach. Cardiac shadow is within
normal limits. No focal infiltrate or effusion is seen. Previously
seen upper lobe atelectasis has cleared. No pneumothorax is noted.
IMPRESSION: Tubes and lines in satisfactory position.

Improved inspiratory effort when compared with the prior exam.

## 2020-10-02 IMAGING — MR MR HEAD WO/W CM
12 of 14 series · 40 of 48 positions shown · IV contrast (gadavist)
Comparison: Head CT same day and 10/20/2019

CLINICAL DATA: Cardiac arrest 4 days ago.  Anoxic brain injury.

EXAM:
MRI HEAD WITHOUT AND WITH CONTRAST
TECHNIQUE: Multiplanar, multiecho pulse sequences of the brain and surrounding
structures were obtained without and with intravenous contrast.
CONTRAST:  7.5mL GADAVIST GADOBUTROL 1 MMOL/ML IV SOLN

[Series 5: DWI · axial · 3.0mm · 0.88mm/px · z∈[-105,+34]mm · 7 of 96 slices shown (1 of 4)]
[im 1/96]
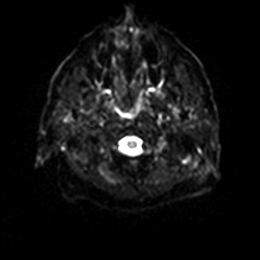
[im 16/96]
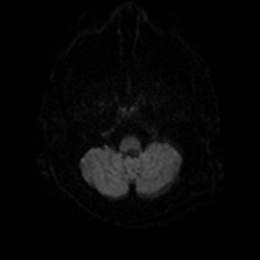
[im 32/96]
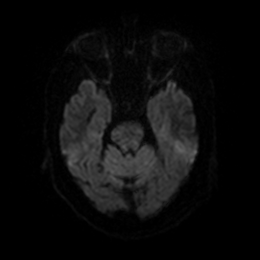
[im 48/96]
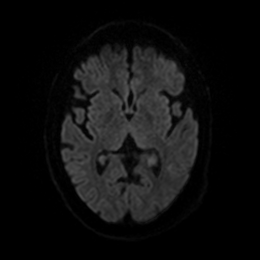
[im 64/96]
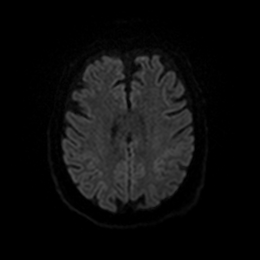
[im 80/96]
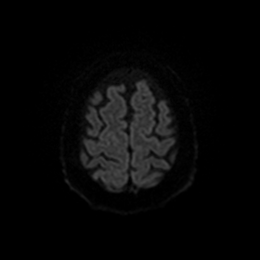
[im 96/96]
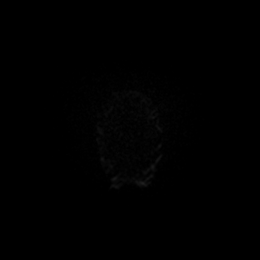

[Series 6: DWI · axial · 3.0mm · 0.88mm/px · z∈[-105,+34]mm · 4 of 48 slices shown (2 of 4)]
[im 1/48]
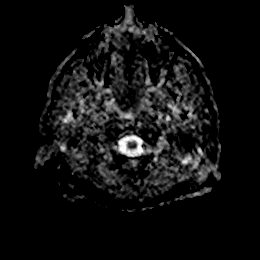
[im 16/48]
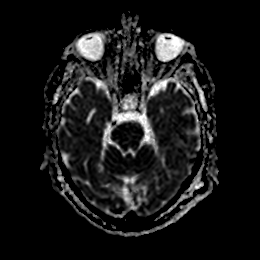
[im 32/48]
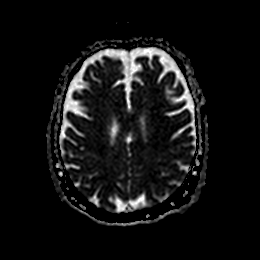
[im 48/48]
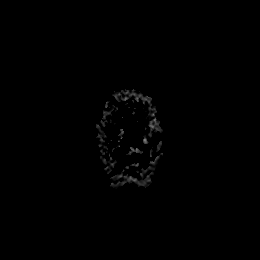

[Series 7: DWI · coronal · 4.0mm · 0.88mm/px · 6 of 70 slices shown (3 of 4)]
[im 1/70]
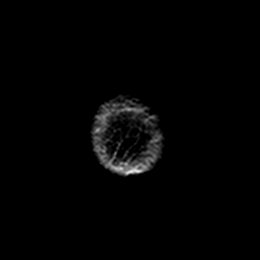
[im 14/70]
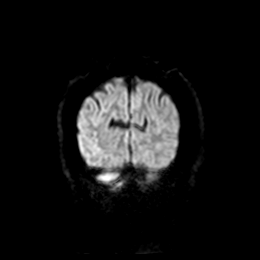
[im 28/70]
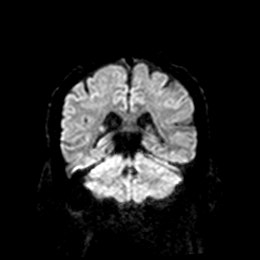
[im 42/70]
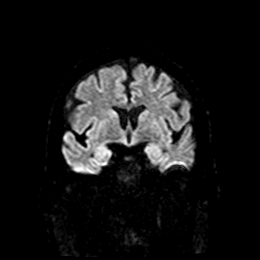
[im 56/70]
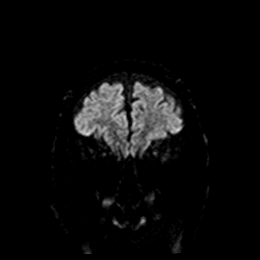
[im 70/70]
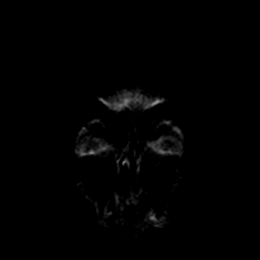

[Series 8: DWI · coronal · 4.0mm · 0.88mm/px · 3 of 35 slices shown (4 of 4)]
[im 1/35]
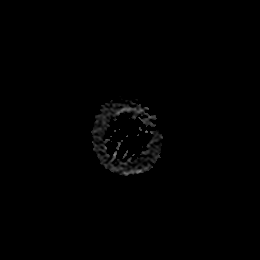
[im 18/35]
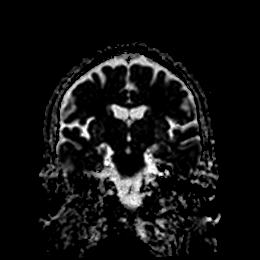
[im 35/35]
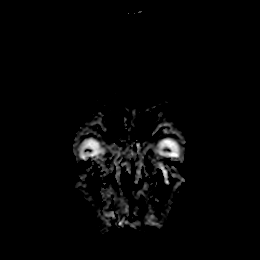

[Series 9: T1 · sagittal · 5.0mm · 0.75mm/px · 2 of 23 slices shown]
[im 1/23]
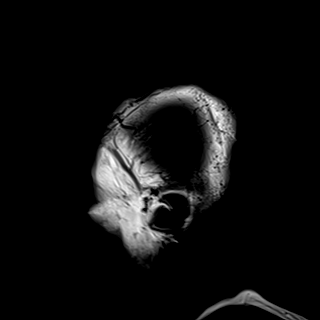
[im 23/23]
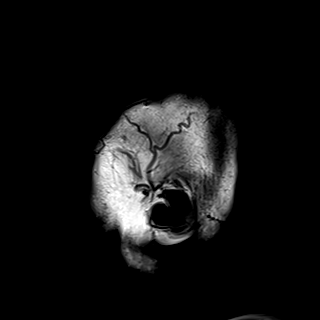

[Series 10: T2 · axial · 5.0mm · 0.72mm/px · z∈[-107,+35]mm · 2 of 25 slices shown]
[im 1/25]
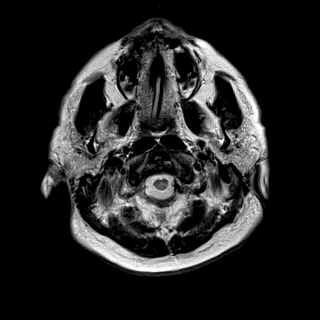
[im 25/25]
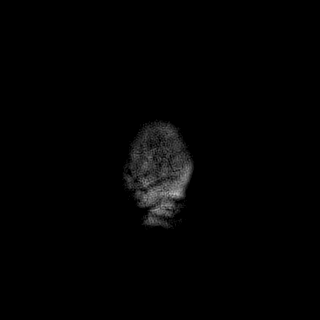

[Series 11: FLAIR · axial · 5.0mm · 0.45mm/px · z∈[-106,+36]mm · 2 of 25 slices shown]
[im 1/25]
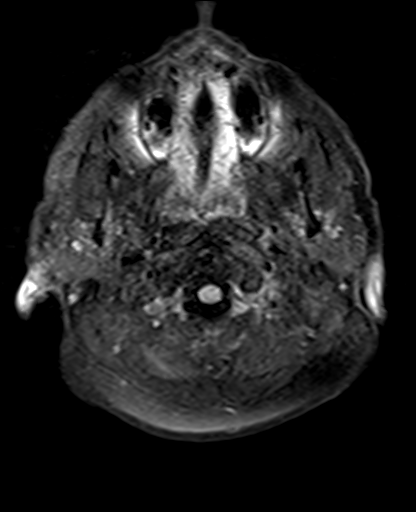
[im 25/25]
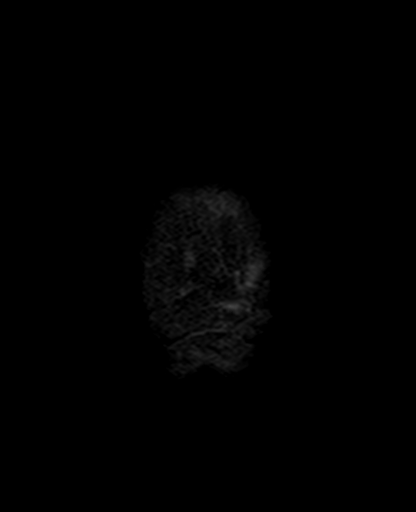

[Series 14: swi_images · axial · 3.0mm · 0.90mm/px · z∈[-110,+40]mm · 4 of 52 slices shown]
[im 1/52]
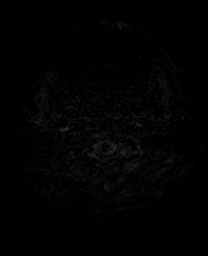
[im 18/52]
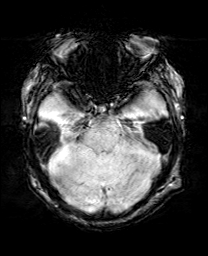
[im 35/52]
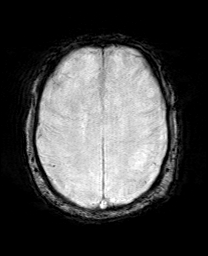
[im 52/52]
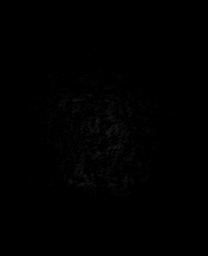

[Series 15: mip_images(sw) · axial · 24.0mm · 0.90mm/px · z∈[-100,+30]mm · 4 of 45 slices shown]
[im 1/45]
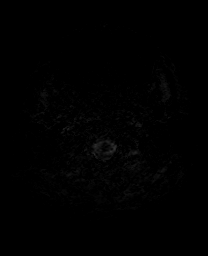
[im 15/45]
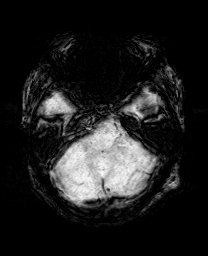
[im 30/45]
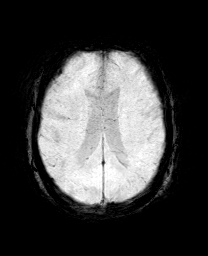
[im 45/45]
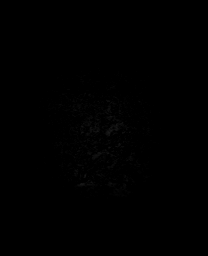

[Series 17: T2 post-contrast · coronal · 5.0mm · 0.72mm/px · 2 of 28 slices shown]
[im 1/28]
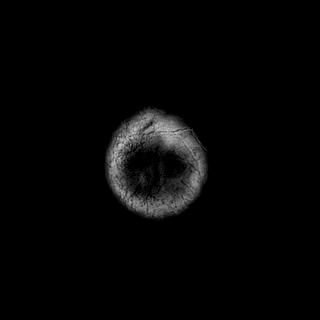
[im 28/28]
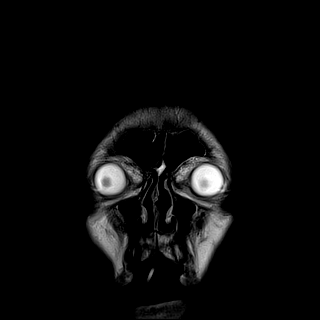

[Series 19: T1 post-contrast · coronal · 5.0mm · 0.34mm/px · 2 of 28 slices shown (1 of 2)]
[im 1/28]
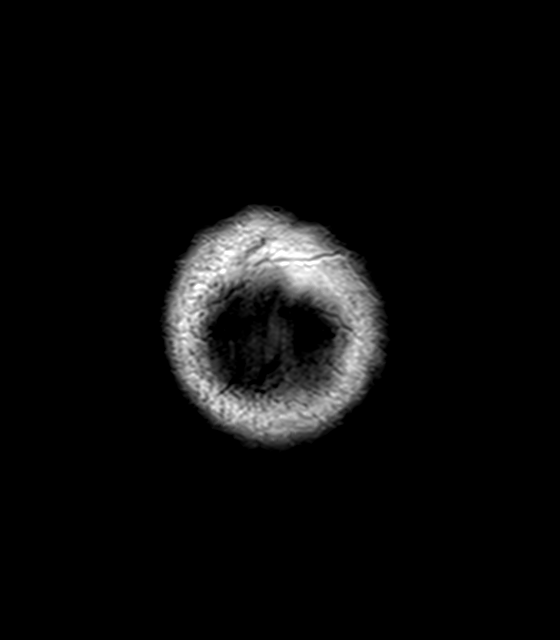
[im 28/28]
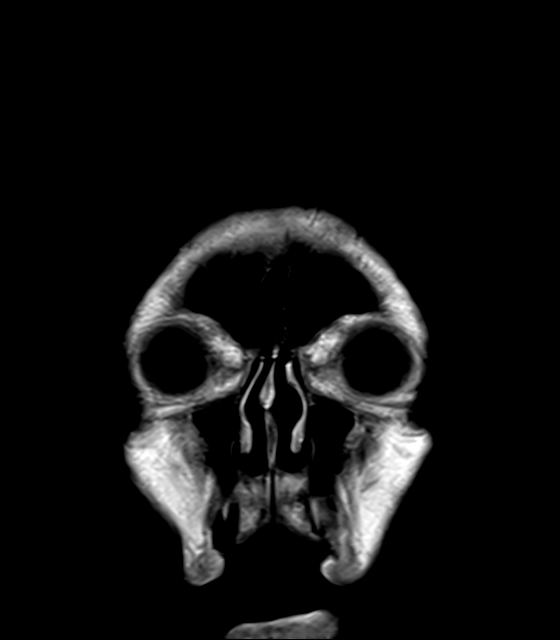

[Series 20: T1 post-contrast · sagittal · 5.0mm · 0.72mm/px · 2 of 23 slices shown (2 of 2)]
[im 1/23]
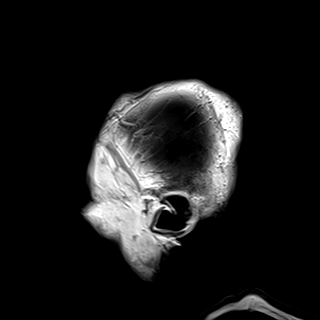
[im 23/23]
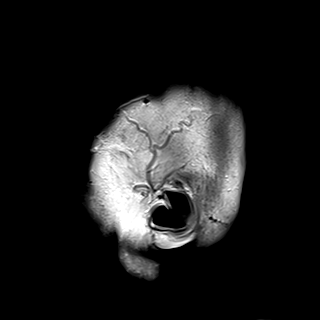

[40 of 48 positions shown; findings below may reference images not displayed]

FINDINGS: Brain: Diffusion imaging shows some hypoxic ischemic injury to the
caudate nuclei and possibly to a lesser extent the putamen. No other
acute insult identified. Otherwise, the brainstem and cerebellum are
normal. Cerebral hemispheres show minor chronic small-vessel change
of the white matter. No cortical or large vessel territory insult.
No mass lesion, hemorrhage, hydrocephalus or extra-axial collection.

Vascular: Major vessels at the base of the brain show flow.

Skull and upper cervical spine: Negative

Sinuses/Orbits: Clear/normal

Other: None
IMPRESSION: The study shows mild restricted diffusion abnormality affecting the
caudate nuclei and possibly to a lesser extent the putamena. Insult
to the corpus striatum is one of the manifestations of hypoxic
ischemic brain injury. No other sign of acute insult. Otherwise
there is mild age related atrophy and chronic small-vessel change of
the white matter.
